# Patient Record
Sex: Male | Born: 1935 | Race: Black or African American | Hispanic: No | Smoking: Never smoker
Health system: Southern US, Community
[De-identification: ages and names within clinical notes are randomized; demographics above are authoritative.]

## PROBLEM LIST (undated history)

## (undated) DIAGNOSIS — H269 Unspecified cataract: Secondary | ICD-10-CM

## (undated) DIAGNOSIS — M1990 Unspecified osteoarthritis, unspecified site: Secondary | ICD-10-CM

## (undated) HISTORY — PX: OTHER SURGICAL HISTORY: SHX169

## (undated) HISTORY — PX: RECTAL SURGERY: SHX760

## (undated) HISTORY — PX: JOINT REPLACEMENT: SHX530

## (undated) SURGERY — MINOR CAPSULOTOMY
Anesthesia: Choice | Laterality: Right

---

## 1997-08-04 ENCOUNTER — Inpatient Hospital Stay (HOSPITAL_COMMUNITY): Admission: EM | Admit: 1997-08-04 | Discharge: 1997-08-09 | Payer: Self-pay | Admitting: Emergency Medicine

## 2001-10-13 ENCOUNTER — Encounter: Payer: Self-pay | Admitting: Emergency Medicine

## 2001-10-13 ENCOUNTER — Inpatient Hospital Stay (HOSPITAL_COMMUNITY): Admission: EM | Admit: 2001-10-13 | Discharge: 2001-10-14 | Payer: Self-pay | Admitting: *Deleted

## 2003-03-12 ENCOUNTER — Encounter: Admission: RE | Admit: 2003-03-12 | Discharge: 2003-03-12 | Payer: Self-pay | Admitting: Internal Medicine

## 2003-04-13 ENCOUNTER — Ambulatory Visit (HOSPITAL_COMMUNITY): Admission: RE | Admit: 2003-04-13 | Discharge: 2003-04-13 | Payer: Self-pay | Admitting: *Deleted

## 2004-11-18 IMAGING — CT CT CHEST W/ CM
1 of 2 series · 14 of 29 positions shown, 18 images · IV contrast (125 ml omni 300)
Comparison: none

CLINICAL DATA: Follow-up thoracic aneurysm.  Con ? none
 CT SCAN OF THE CHEST WITH CONTRAST
 Spiral scanning was performed during intravenous administration of 125 cc of Omnipaque 300.  Comparison is made to a previous chest radiograph. 
 The lungs are clear.  No evidence of mass, infiltrate, or atelectasis.  There is no pleural or pericardial fluid.  There is no mediastinal or hilar adenopathy.  The thoracic aorta is unfolded, but there is no aneurysm.  The patient does not show microscopic calcification within the wall of the aorta or any luminal thrombus or other vascular irregularity.  Pulmonary arterial tree is likewise normal.  
 Scans in the upper abdomen show no significant lesion.  There is probably a 1 cm cyst at the upper pole of the left kidney. 
 IMPRESSION
 Negative CT scan of the chest.  No vascular pathology evident.  See above for full discussion.

[Series 2: routine chest · axial · 0.66mm/px · z∈[-219,+21]mm · 14 of 56 slices shown, 18 images]
[im 4/56  mediastinal]
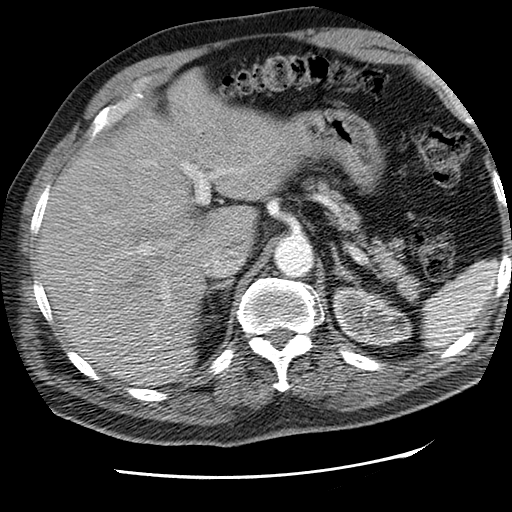
[im 4/56  lung]
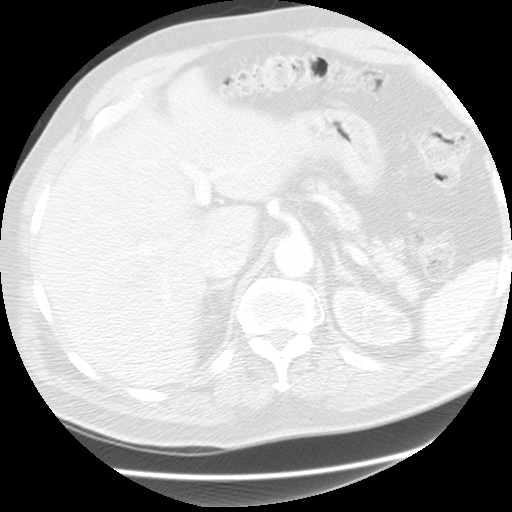
[im 8/56  lung]
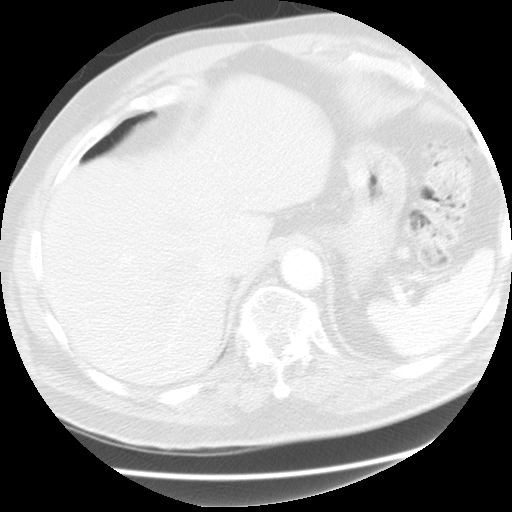
[im 12/56  lung]
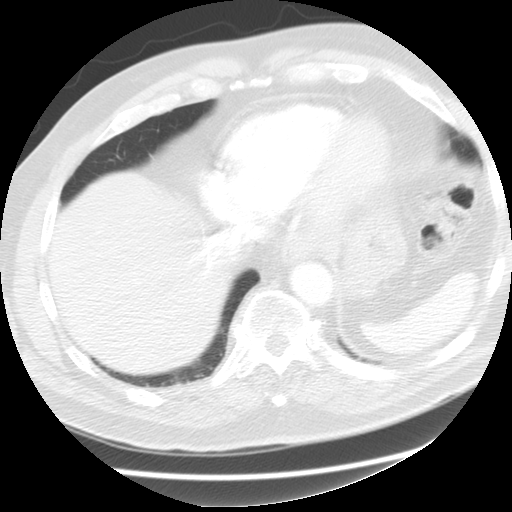
[im 16/56  lung]
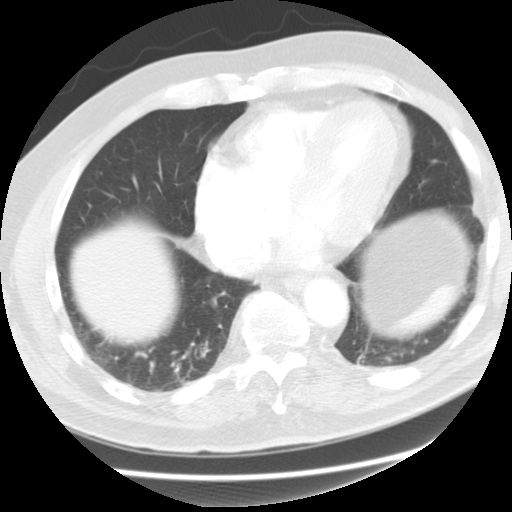
[im 20/56  mediastinal]
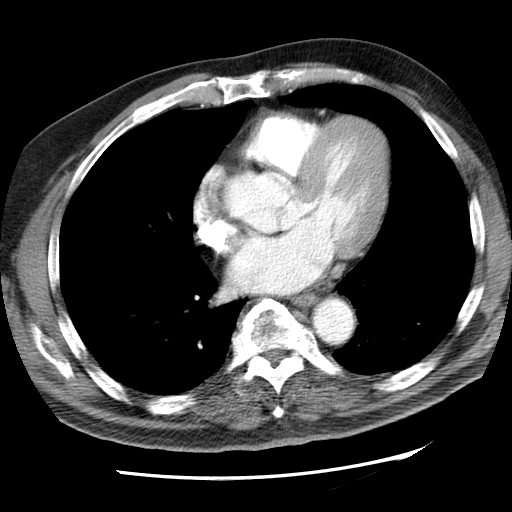
[im 20/56  lung]
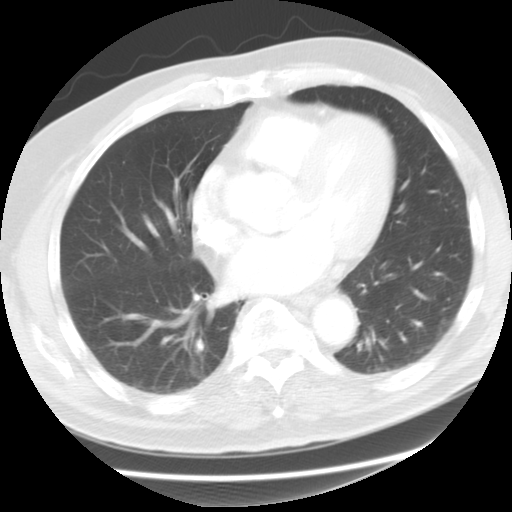
[im 24/56  lung]
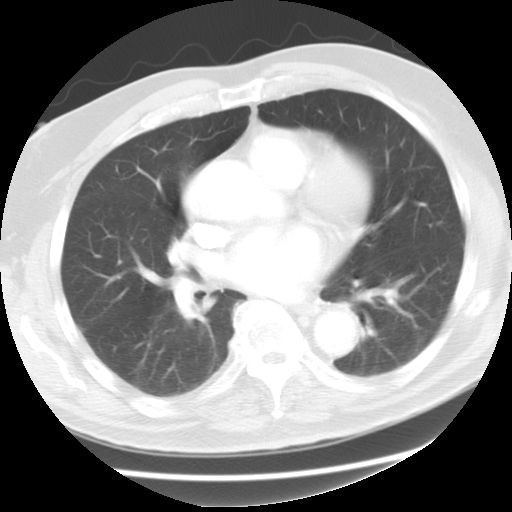
[im 27/56  lung]
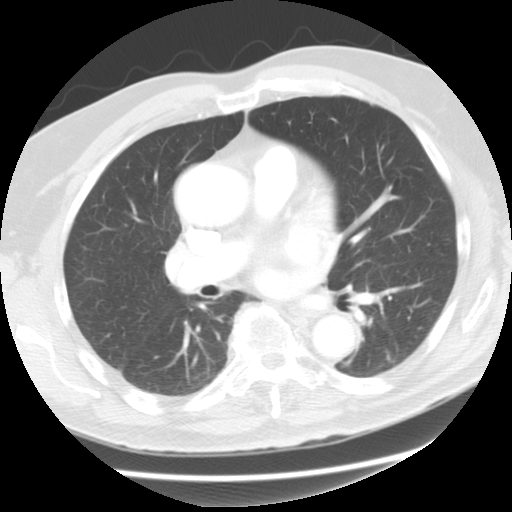
[im 28/56  lung]
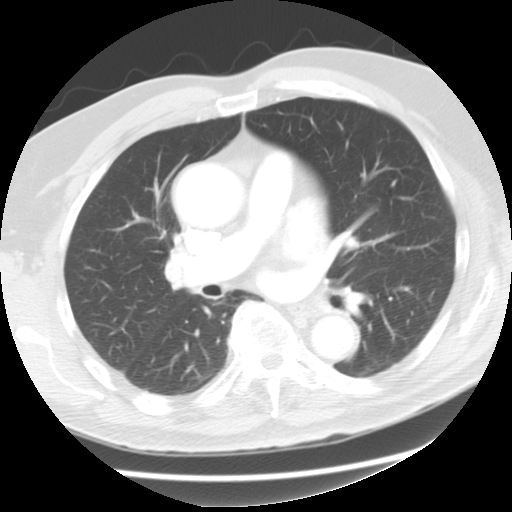
[im 32/56  mediastinal]
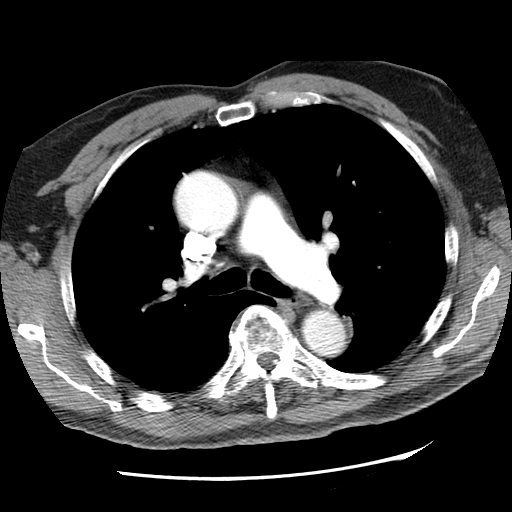
[im 32/56  lung]
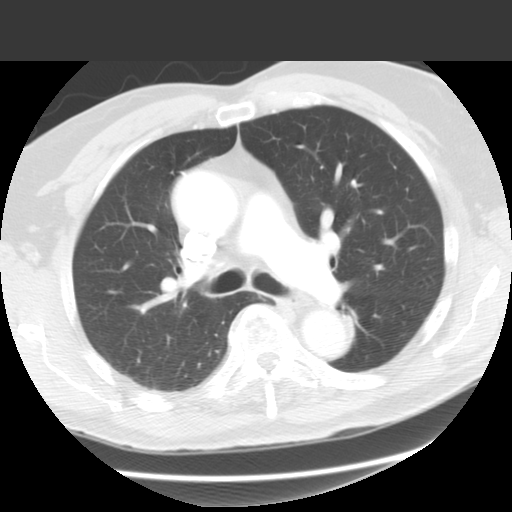
[im 36/56  lung]
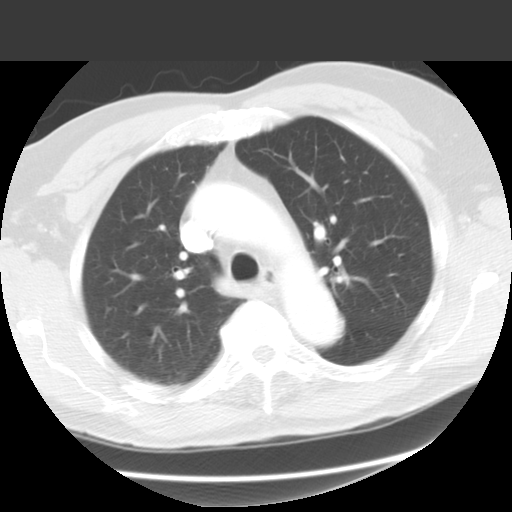
[im 40/56  lung]
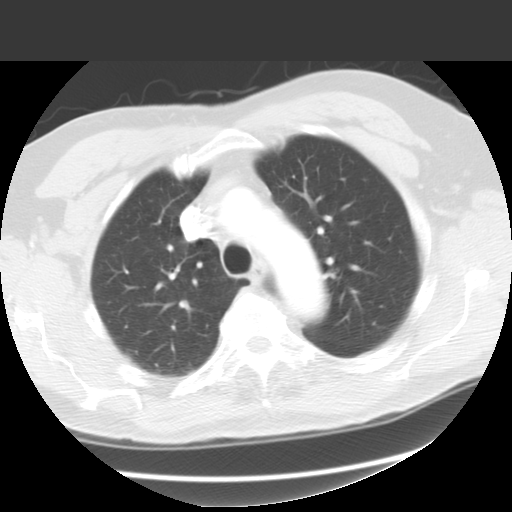
[im 44/56  lung]
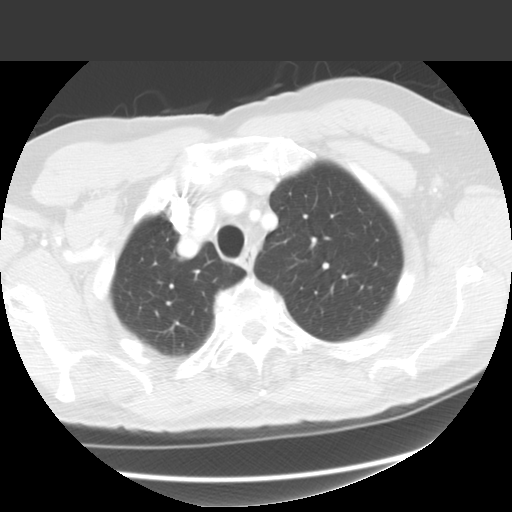
[im 48/56  mediastinal]
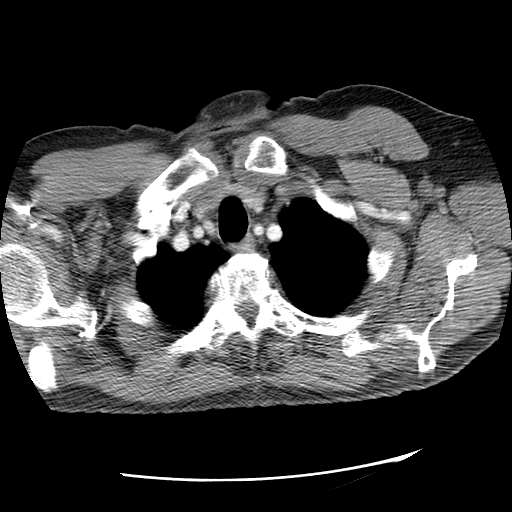
[im 48/56  lung]
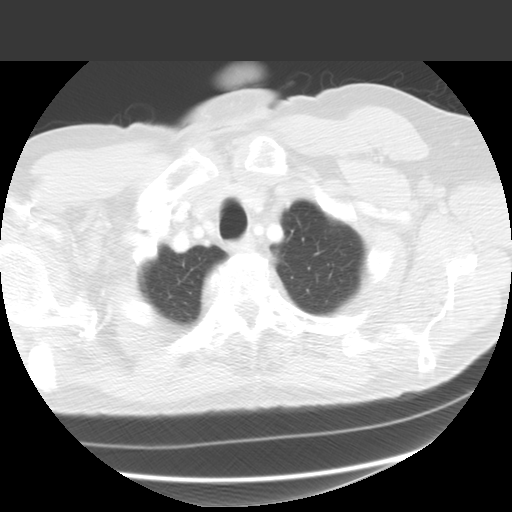
[im 52/56  lung]
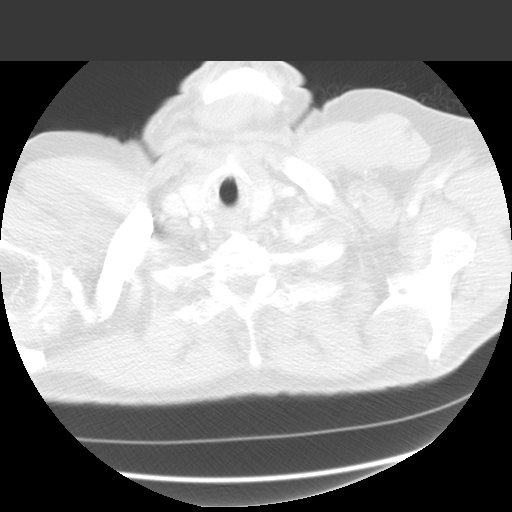

[14 of 29 positions shown; findings below may reference images not displayed]

## 2005-06-21 ENCOUNTER — Ambulatory Visit (HOSPITAL_COMMUNITY): Admission: RE | Admit: 2005-06-21 | Discharge: 2005-06-21 | Payer: Self-pay | Admitting: *Deleted

## 2011-11-06 ENCOUNTER — Other Ambulatory Visit: Payer: Self-pay | Admitting: Ophthalmology

## 2011-11-06 NOTE — H&P (Signed)
  Pre-operative History and Physical for Ophthalmic Surgery  Joshua Frost 11/06/2011                  Chief Complaint: Decreased vision  Diagnosis: Cataract right eye  Allergies not on file No known Allergies  Medications: Vitamin B12, VitaminD, Multivitamins  Prior to Admission medications   Not on File    Planned Procedure:                                       Phacoemulsification, Posterior Chamber Intra-ocular Lens Right Eye                                       Acrysof MA50BM  + 21. 00 Diopter for implant OD   There were no vitals filed for this visit.  Pulse: 76         Temp: NE        Resp:  18       ROS: non-contributory  No past medical history on file.  No past surgical history on file.   History   Social History  . Marital Status: Widowed    Spouse Name: N/A    Number of Children: N/A  . Years of Education: N/A   Occupational History  . Not on file.   Social History Main Topics  . Smoking status: Not on file  . Smokeless tobacco: Not on file  . Alcohol Use: Not on file  . Drug Use: Not on file  . Sexually Active: Not on file   Other Topics Concern  . Not on file   Social History Narrative  . No narrative on file     The following examination is for anesthesia clearance for minimally invasive Ophthalmic surgery. It is primarily to document heart and lung findings and is not intended to elucidate unknown general medical conditions inclusive of abdominal masses, lung lesions, etc.   General Constitution:  Post right hip replacement with reduced range of motion   Alertness/Orientation:  Person, time place     yes   HEENT:  Eye Findings: Cataract                  left eye  Neck: supple without masses  Chest/Lungs: clear to auscultation  Cardiac: Normal S1 and S2 without Murmur, S3 or S4  Neuro: non-focal  Impression: Cataract right eye  Planned Procedure:  Phacoemulsification, Posterior Chamber Intraocular Lens OD    Shade Flood, MD

## 2011-11-07 ENCOUNTER — Encounter (HOSPITAL_COMMUNITY): Payer: Self-pay | Admitting: Pharmacy Technician

## 2011-11-15 ENCOUNTER — Encounter (HOSPITAL_COMMUNITY): Payer: Self-pay

## 2011-11-15 ENCOUNTER — Encounter (HOSPITAL_COMMUNITY)
Admission: RE | Admit: 2011-11-15 | Discharge: 2011-11-15 | Disposition: A | Discharge status: Home / Self Care | Payer: Medicare Other | Source: Ambulatory Visit | Attending: Ophthalmology | Admitting: Ophthalmology

## 2011-11-15 HISTORY — DX: Unspecified cataract: H26.9

## 2011-11-15 HISTORY — DX: Unspecified osteoarthritis, unspecified site: M19.90

## 2011-11-15 LAB — CBC
HCT: 39.2 % (ref 39.0–52.0)
MCH: 30.4 pg (ref 26.0–34.0)
MCHC: 33.7 g/dL (ref 30.0–36.0)
MCV: 90.3 fL (ref 78.0–100.0)
Platelets: 134 10*3/uL — ABNORMAL LOW (ref 150–400)
RDW: 13.4 % (ref 11.5–15.5)
WBC: 5 10*3/uL (ref 4.0–10.5)

## 2011-11-15 NOTE — Progress Notes (Signed)
Pt doesn't have a cardiologist  Stress test/echo done 20+yrs ago  Denies ever having a heart cath  Dr.Walter Renne Crigler is [facility]  EKG done at yearly physical-to request copy from Dr.Pharr Denies having a cxr in the past year

## 2011-11-15 NOTE — Pre-Procedure Instructions (Signed)
20 Joshua Frost  11/15/2011   Your procedure is scheduled on:  Wed, Nov 6 @ 3:00 PM  Report to [facility] at 1:00 PM.  Call this number if you have problems the morning of surgery: [phone]   Remember:   Do not eat food:After Midnight.     Do not wear jewelry  Do not wear lotions, powders, or colognes. You may wear deodorant.  Men may shave face and neck.  Do not bring valuables to the hospital.  Contacts, dentures or bridgework may not be worn into surgery.  Leave suitcase in the car. After surgery it may be brought to your room.  For patients admitted to the hospital, checkout time is 11:00 AM the day of discharge.   Patients discharged the day of surgery will not be allowed to drive home.    Special Instructions: Shower using CHG 2 nights before surgery and the night before surgery.  If you shower the day of surgery use CHG.  Use special wash - you have one bottle of CHG for all showers.  You should use approximately 1/3 of the bottle for each shower.   Please read over the following fact sheets that you were given: Pain Booklet, Coughing and Deep Breathing and Surgical Site Infection Prevention

## 2011-11-21 MED ORDER — GATIFLOXACIN 0.5 % OP SOLN
1.0000 [drp] | OPHTHALMIC | Status: AC | PRN
  Administered 2011-11-22 (×3): 1 [drp] via OPHTHALMIC
  Filled 2011-11-21: qty 2.5

## 2011-11-21 MED ORDER — TETRACAINE HCL 0.5 % OP SOLN
2.0000 [drp] | OPHTHALMIC | Status: AC
  Administered 2011-11-22: 2 [drp] via OPHTHALMIC
  Filled 2011-11-21: qty 2

## 2011-11-21 MED ORDER — PHENYLEPHRINE HCL 2.5 % OP SOLN
1.0000 [drp] | OPHTHALMIC | Status: AC | PRN
  Administered 2011-11-22 (×3): 1 [drp] via OPHTHALMIC
  Filled 2011-11-21: qty 3

## 2011-11-21 MED ORDER — PREDNISOLONE ACETATE 1 % OP SUSP
1.0000 [drp] | OPHTHALMIC | Status: AC
  Administered 2011-11-22: 1 [drp] via OPHTHALMIC
  Filled 2011-11-21: qty 5

## 2011-11-22 ENCOUNTER — Encounter (HOSPITAL_COMMUNITY): Payer: Self-pay | Admitting: Anesthesiology

## 2011-11-22 ENCOUNTER — Encounter (HOSPITAL_COMMUNITY)
Admission: RE | Disposition: A | Discharge status: Home / Self Care | Payer: Self-pay | Source: Ambulatory Visit | Attending: Ophthalmology

## 2011-11-22 ENCOUNTER — Encounter (HOSPITAL_COMMUNITY): Payer: Self-pay | Admitting: *Deleted

## 2011-11-22 ENCOUNTER — Ambulatory Visit (HOSPITAL_COMMUNITY): Payer: Medicare Other | Admitting: Anesthesiology

## 2011-11-22 ENCOUNTER — Ambulatory Visit (HOSPITAL_COMMUNITY)
Admission: RE | Admit: 2011-11-22 | Discharge: 2011-11-22 | Disposition: A | Discharge status: Home / Self Care | Payer: Medicare Other | Source: Ambulatory Visit | Attending: Ophthalmology | Admitting: Ophthalmology

## 2011-11-22 DIAGNOSIS — Z96649 Presence of unspecified artificial hip joint: Secondary | ICD-10-CM | POA: Insufficient documentation

## 2011-11-22 DIAGNOSIS — H2510 Age-related nuclear cataract, unspecified eye: Secondary | ICD-10-CM | POA: Insufficient documentation

## 2011-11-22 DIAGNOSIS — Z01812 Encounter for preprocedural laboratory examination: Secondary | ICD-10-CM | POA: Insufficient documentation

## 2011-11-22 HISTORY — PX: CATARACT EXTRACTION W/PHACO: SHX586

## 2011-11-22 SURGERY — PHACOEMULSIFICATION, CATARACT, WITH IOL INSERTION
Anesthesia: Monitor Anesthesia Care | Site: Eye | Laterality: Right | Wound class: Clean

## 2011-11-22 MED ORDER — PROMETHAZINE HCL 25 MG/ML IJ SOLN: 6.2500 mg | INTRAMUSCULAR | Status: DC | PRN

## 2011-11-22 MED ORDER — MEPERIDINE HCL 25 MG/ML IJ SOLN: 6.2500 mg | INTRAMUSCULAR | Status: DC | PRN

## 2011-11-22 MED ORDER — MIDAZOLAM HCL 2 MG/2ML IJ SOLN: 0.5000 mg | Freq: Once | INTRAMUSCULAR | Status: DC | PRN

## 2011-11-22 MED ORDER — FENTANYL CITRATE 0.05 MG/ML IJ SOLN: 25.0000 ug | INTRAMUSCULAR | Status: DC | PRN

## 2011-11-22 MED ORDER — TRIAMCINOLONE ACETONIDE 40 MG/ML IJ SUSP
INTRAMUSCULAR | Status: AC
  Filled 2011-11-22: qty 1

## 2011-11-22 MED ORDER — DEXAMETHASONE SODIUM PHOSPHATE 10 MG/ML IJ SOLN
INTRAMUSCULAR | Status: DC | PRN
  Administered 2011-11-22: 10 mg

## 2011-11-22 MED ORDER — BACITRACIN-POLYMYXIN B 500-10000 UNIT/GM OP OINT
TOPICAL_OINTMENT | OPHTHALMIC | Status: AC
  Filled 2011-11-22: qty 3.5

## 2011-11-22 MED ORDER — BSS IO SOLN
INTRAOCULAR | Status: DC | PRN
  Administered 2011-11-22: 15 mL via INTRAOCULAR

## 2011-11-22 MED ORDER — BACITRACIN-POLYMYXIN B 500-10000 UNIT/GM OP OINT
TOPICAL_OINTMENT | OPHTHALMIC | Status: DC | PRN
  Administered 2011-11-22: 1 via OPHTHALMIC

## 2011-11-22 MED ORDER — OXYCODONE HCL 5 MG/5ML PO SOLN: 5.0000 mg | Freq: Once | ORAL | Status: DC | PRN

## 2011-11-22 MED ORDER — PROVISC 10 MG/ML IO SOLN
INTRAOCULAR | Status: DC | PRN
  Administered 2011-11-22: .85 mL via INTRAOCULAR

## 2011-11-22 MED ORDER — DEXAMETHASONE SODIUM PHOSPHATE 10 MG/ML IJ SOLN
INTRAMUSCULAR | Status: AC
  Filled 2011-11-22: qty 1

## 2011-11-22 MED ORDER — HYPROMELLOSE (GONIOSCOPIC) 2.5 % OP SOLN
OPHTHALMIC | Status: AC
  Filled 2011-11-22: qty 15

## 2011-11-22 MED ORDER — CEFAZOLIN SUBCONJUNCTIVAL INJECTION 100 MG/0.5 ML
200.0000 mg | INJECTION | SUBCONJUNCTIVAL | Status: AC
  Administered 2011-11-22: 200 mg via SUBCONJUNCTIVAL
  Filled 2011-11-22: qty 1

## 2011-11-22 MED ORDER — NA CHONDROIT SULF-NA HYALURON 40-30 MG/ML IO SOLN
INTRAOCULAR | Status: AC
  Filled 2011-11-22: qty 0.5

## 2011-11-22 MED ORDER — OXYCODONE HCL 5 MG PO TABS: 5.0000 mg | ORAL_TABLET | Freq: Once | ORAL | Status: DC | PRN

## 2011-11-22 MED ORDER — NA CHONDROIT SULF-NA HYALURON 40-30 MG/ML IO SOLN
INTRAOCULAR | Status: DC | PRN
  Administered 2011-11-22: 0.5 mL via INTRAOCULAR

## 2011-11-22 MED ORDER — SODIUM CHLORIDE 0.9 % IV SOLN
INTRAVENOUS | Status: DC | PRN
  Administered 2011-11-22: 17:00:00 via INTRAVENOUS

## 2011-11-22 MED ORDER — EPINEPHRINE HCL 1 MG/ML IJ SOLN
INTRAOCULAR | Status: DC | PRN
  Administered 2011-11-22: 17:00:00

## 2011-11-22 MED ORDER — SODIUM CHLORIDE 0.9 % IV SOLN
INTRAVENOUS | Status: DC
  Administered 2011-11-22 (×2): via INTRAVENOUS

## 2011-11-22 MED ORDER — PROPOFOL 10 MG/ML IV BOLUS
INTRAVENOUS | Status: DC | PRN
  Administered 2011-11-22: 40 mg via INTRAVENOUS

## 2011-11-22 MED ORDER — BSS IO SOLN
INTRAOCULAR | Status: AC
  Filled 2011-11-22: qty 500

## 2011-11-22 MED ORDER — LIDOCAINE HCL 2 % IJ SOLN
INTRAMUSCULAR | Status: AC
  Filled 2011-11-22: qty 20

## 2011-11-22 MED ORDER — LIDOCAINE HCL (PF) 2 % IJ SOLN
INTRAMUSCULAR | Status: DC | PRN
  Administered 2011-11-22: 10 mL

## 2011-11-22 MED ORDER — SODIUM HYALURONATE 10 MG/ML IO SOLN
INTRAOCULAR | Status: AC
  Filled 2011-11-22: qty 0.85

## 2011-11-22 MED ORDER — HYPROMELLOSE (GONIOSCOPIC) 2.5 % OP SOLN
OPHTHALMIC | Status: DC | PRN
  Administered 2011-11-22: 1 [drp] via OPHTHALMIC

## 2011-11-22 MED ORDER — EPINEPHRINE HCL 1 MG/ML IJ SOLN
INTRAMUSCULAR | Status: AC
  Filled 2011-11-22: qty 1

## 2011-11-22 MED ORDER — BUPIVACAINE HCL (PF) 0.75 % IJ SOLN
INTRAMUSCULAR | Status: DC | PRN
  Administered 2011-11-22: 10 mL

## 2011-11-22 MED ORDER — ACETAZOLAMIDE SODIUM 500 MG IJ SOLR
INTRAMUSCULAR | Status: AC
  Filled 2011-11-22: qty 500

## 2011-11-22 SURGICAL SUPPLY — 56 items
APL SRG 3 HI ABS STRL LF PLS (MISCELLANEOUS) ×1
APPLICATOR COTTON TIP 6IN STRL (MISCELLANEOUS) ×2 IMPLANT
APPLICATOR DR MATTHEWS STRL (MISCELLANEOUS) ×2 IMPLANT
BAG FLD CLT MN 6.25X3.5 (WOUND CARE) ×1
BAG MINI COLL DRAIN (WOUND CARE) ×2 IMPLANT
BLADE EYE MINI 60D BEAVER (BLADE) IMPLANT
BLADE KERATOME 2.75 (BLADE) ×2 IMPLANT
BLADE STAB KNIFE 15DEG (BLADE) IMPLANT
CANNULA ANTERIOR CHAMBER 27GA (MISCELLANEOUS) IMPLANT
CLOTH BEACON ORANGE TIMEOUT ST (SAFETY) ×2 IMPLANT
DRAPE OPHTHALMIC 77X100 STRL (CUSTOM PROCEDURE TRAY) ×2 IMPLANT
DRAPE POUCH INSTRU U-SHP 10X18 (DRAPES) ×2 IMPLANT
DRSG TEGADERM 4X4.75 (GAUZE/BANDAGES/DRESSINGS) ×2 IMPLANT
GLOVE SS BIOGEL STRL SZ 6.5 (GLOVE) ×1 IMPLANT
GLOVE SUPERSENSE BIOGEL SZ 6.5 (GLOVE) ×1
GOWN SRG XL XLNG 56XLVL 4 (GOWN DISPOSABLE) ×1 IMPLANT
GOWN STRL NON-REIN LRG LVL3 (GOWN DISPOSABLE) ×2 IMPLANT
GOWN STRL NON-REIN XL XLG LVL4 (GOWN DISPOSABLE) ×2
KIT BASIN OR (CUSTOM PROCEDURE TRAY) ×2 IMPLANT
KIT ROOM TURNOVER OR (KITS) ×1 IMPLANT
KNIFE GRIESHABER SHARP 2.5MM (MISCELLANEOUS) ×2 IMPLANT
LENS IOL ACRYSOF MP POST 21.0 (Intraocular Lens) ×1 IMPLANT
MASK EYE SHIELD (GAUZE/BANDAGES/DRESSINGS) ×1 IMPLANT
NDL 18GX1X1/2 (RX/OR ONLY) (NEEDLE) IMPLANT
NDL 25GX 5/8IN NON SAFETY (NEEDLE) ×1 IMPLANT
NDL FILTER BLUNT 18X1 1/2 (NEEDLE) IMPLANT
NDL HYPO 30X.5 LL (NEEDLE) ×2 IMPLANT
NEEDLE 18GX1X1/2 (RX/OR ONLY) (NEEDLE) ×2 IMPLANT
NEEDLE 22X1 1/2 (OR ONLY) (NEEDLE) ×2 IMPLANT
NEEDLE 25GX 5/8IN NON SAFETY (NEEDLE) ×10 IMPLANT
NEEDLE FILTER BLUNT 18X 1/2SAF (NEEDLE) ×1
NEEDLE FILTER BLUNT 18X1 1/2 (NEEDLE) ×1 IMPLANT
NEEDLE HYPO 30X.5 LL (NEEDLE) ×4 IMPLANT
NS IRRIG 1000ML POUR BTL (IV SOLUTION) ×2 IMPLANT
PACK CATARACT CUSTOM (CUSTOM PROCEDURE TRAY) ×2 IMPLANT
PACK CATARACT MCHSCP (PACKS) ×2 IMPLANT
PAD ARMBOARD 7.5X6 YLW CONV (MISCELLANEOUS) ×3 IMPLANT
PAD EYE OVAL STERILE LF (GAUZE/BANDAGES/DRESSINGS) ×1 IMPLANT
PHACO TIP KELMAN 45DEG (TIP) ×2 IMPLANT
SHUTTLE MONARCH TYPE A (NEEDLE) ×2 IMPLANT
SOLUTION ANTI FOG 6CC (MISCELLANEOUS) ×2 IMPLANT
SPEAR EYE SURG WECK-CEL (MISCELLANEOUS) ×2 IMPLANT
SUT ETHILON 10-0 CS-B-6CS-B-6 (SUTURE)
SUT ETHILON 5 0 P 3 18 (SUTURE)
SUT ETHILON 9 0 TG140 8 (SUTURE) IMPLANT
SUT NYLON ETHILON 5-0 P-3 1X18 (SUTURE) IMPLANT
SUT PLAIN 6 0 TG1408 (SUTURE) IMPLANT
SUT POLY NON ABSORB 10-0 8 STR (SUTURE) IMPLANT
SUTURE EHLN 10-0 CS-B-6CS-B-6 (SUTURE) IMPLANT
SYR 5ML LL (SYRINGE) IMPLANT
SYR TB 1ML LUER SLIP (SYRINGE) ×1 IMPLANT
SYRINGE 10CC LL (SYRINGE) IMPLANT
TAPE PAPER MEDFIX 1IN X 10YD (GAUZE/BANDAGES/DRESSINGS) ×1 IMPLANT
TOWEL OR 17X24 6PK STRL BLUE (TOWEL DISPOSABLE) ×4 IMPLANT
WATER STERILE IRR 1000ML POUR (IV SOLUTION) ×2 IMPLANT
WIPE INSTRUMENT VISIWIPE 73X73 (MISCELLANEOUS) ×2 IMPLANT

## 2011-11-22 NOTE — Op Note (Signed)
Joshua Frost 11/22/2011 Cataract: Combined, Nuclear  Procedure: Phacoemulsification, Posterior Chamber Intra-ocular Lens Operative Eye:  right eye  Surgeon: Shade Flood Estimated Blood Loss: minimal Specimens for Pathology:  None Complications: none  The patient was prepared and draped in the usual manner for ocular surgery on the right eye. A Cook lid speculum was placed. A peripheral clear corneal incision was made at the surgical limbus centered at the 11:00 meridian. A separate clear corneal stab incision was made with a 15 degree blade at the 2:00 meridian to permit bi-manual technique. Viscoat and  Provisc as an underlying layer next to the capsule was instilled into the anterior chamber through that incision.  A keratome was used to create a self sealing incision entering the anterior chamber at the 11:00 meridian. A capsulorhexis was performed using a bent 25g needle. The lens was hydrodissected and the nucleus was hydrodilineated using a Nichammin cannula. The Chang chopper was inserted and used to rotate the lens to insure adequate lens mobility. The phacoemulsification handpiece was inserted and a combined phaco-chop technique was employed, fracturing the lens into separate sections with subsequent removal with the phaco handpiece. The I/A cannula was used to remove remaining lens cortex. Provisc was instilled and used to deepen the anterior chamber and posterior capsule bag. The Monarch injector was used to place a folded Acrysof MA50BM PC IOL, + 21.00  diopters, into the capsule bag. A Sinskey lens hook was used to dial in the trailing haptic.  The I/A cannula was used to remove the viscoelastic from the anterior chamber. BSS was used to bring IOP to the desired range and the wound was checked to insure it was watertight. Subconjunctival injections of Ancef 100/0.3ml and Dexamethasone 0.5 ml of a 10mg /56ml solution were placed without complication. The lid speculum and drapes were  removed and the patient's eye was patched with Polymixin/Bacitracin ophthalmic ointment. An eye shield was placed and the patient was transferred alert and conversant from the operating room to the post-operative recovery area.   Shade Flood, MD

## 2011-11-22 NOTE — Transfer of Care (Signed)
Immediate Anesthesia Transfer of Care Note  Patient: Joshua Frost  Procedure(s) Performed: Procedure(s) (LRB) with comments: CATARACT EXTRACTION PHACO AND INTRAOCULAR LENS PLACEMENT (IOC) (Right)  Patient Location: PACU  Anesthesia Type:MAC  Level of Consciousness: awake, alert , oriented and patient cooperative  Airway & Oxygen Therapy: Patient Spontanous Breathing  Post-op Assessment: Report given to PACU RN, Post -op Vital signs reviewed and stable and Patient moving all extremities  Post vital signs: Reviewed and stable  Complications: No apparent anesthesia complications

## 2011-11-22 NOTE — Anesthesia Preprocedure Evaluation (Signed)
Anesthesia Evaluation  Patient identified by MRN, date of birth, ID band Patient awake    Reviewed: Allergy & Precautions, H&P , NPO status , Patient's Chart, lab work & pertinent test results  History of Anesthesia Complications Negative for: history of anesthetic complications  Airway Mallampati: I TM Distance: >3 FB Neck ROM: Full    Dental  (+) Caps, Missing, Edentulous Upper, Poor Dentition, Loose and Dental Advisory Given   Pulmonary neg pulmonary ROS,  breath sounds clear to auscultation  Pulmonary exam normal       Cardiovascular negative cardio ROS  Rhythm:Regular Rate:Normal     Neuro/Psych negative neurological ROS     GI/Hepatic negative GI ROS, Neg liver ROS,   Endo/Other  negative endocrine ROS  Renal/GU negative Renal ROS     Musculoskeletal   Abdominal   Peds  Hematology negative hematology ROS (+)   Anesthesia Other Findings   Reproductive/Obstetrics                           Anesthesia Physical Anesthesia Plan  ASA: II  Anesthesia Plan: MAC   Post-op Pain Management:    Induction: Intravenous  Airway Management Planned: Nasal Cannula  Additional Equipment:   Intra-op Plan:   Post-operative Plan:   Informed Consent: I have reviewed the patients History and Physical, chart, labs and discussed the procedure including the risks, benefits and alternatives for the proposed anesthesia with the patient or authorized representative who has indicated his/her understanding and acceptance.   Dental advisory given  Plan Discussed with: CRNA and Surgeon  Anesthesia Plan Comments: (Plan routine monitors, MAC with eye block by Dr. Clarisa Kindred)        Anesthesia Quick Evaluation

## 2011-11-22 NOTE — Anesthesia Postprocedure Evaluation (Signed)
  Anesthesia Post-op Note  Patient: Joshua Frost  Procedure(s) Performed: Procedure(s) (LRB) with comments: CATARACT EXTRACTION PHACO AND INTRAOCULAR LENS PLACEMENT (IOC) (Right)  Patient Location: PACU  Anesthesia Type:MAC  Level of Consciousness: awake, alert , oriented and patient cooperative  Airway and Oxygen Therapy: Patient Spontanous Breathing  Post-op Pain: none  Post-op Assessment: Post-op Vital signs reviewed, Patient's Cardiovascular Status Stable, Respiratory Function Stable, Patent Airway, No signs of Nausea or vomiting, Adequate PO intake and Pain level controlled  Post-op Vital Signs: Reviewed and stable  Complications: No apparent anesthesia complications

## 2011-11-22 NOTE — Preoperative (Signed)
Beta Blockers   Reason not to administer Beta Blockers:Not Applicable 

## 2011-11-22 NOTE — Anesthesia Postprocedure Evaluation (Signed)
  Anesthesia Post-op Note  Patient: Joshua Frost  Procedure(s) Performed: Procedure(s) (LRB) with comments: CATARACT EXTRACTION PHACO AND INTRAOCULAR LENS PLACEMENT (IOC) (Right)  Patient Location: PACU  Anesthesia Type:MAC  Level of Consciousness: awake  Airway and Oxygen Therapy: Patient Spontanous Breathing  Post-op Pain: none  Post-op Assessment: Post-op Vital signs reviewed, Patient's Cardiovascular Status Stable, Respiratory Function Stable, Patent Airway, No signs of Nausea or vomiting and Pain level controlled  Post-op Vital Signs: stable  Complications: No apparent anesthesia complications

## 2011-11-22 NOTE — H&P (View-Only) (Signed)
  Pre-operative History and Physical for Ophthalmic Surgery  Joshua Frost 11/06/2011                  Chief Complaint: Decreased vision  Diagnosis: Cataract right eye  Allergies not on file No known Allergies  Medications: Vitamin B12, VitaminD, Multivitamins  Prior to Admission medications   Not on File    Planned Procedure:                                       Phacoemulsification, Posterior Chamber Intra-ocular Lens Right Eye                                       Acrysof MA50BM  + 21. 00 Diopter for implant OD   There were no vitals filed for this visit.  Pulse: 76         Temp: NE        Resp:  18       ROS: non-contributory  No past medical history on file.  No past surgical history on file.   History   Social History  . Marital Status: Widowed    Spouse Name: N/A    Number of Children: N/A  . Years of Education: N/A   Occupational History  . Not on file.   Social History Main Topics  . Smoking status: Not on file  . Smokeless tobacco: Not on file  . Alcohol Use: Not on file  . Drug Use: Not on file  . Sexually Active: Not on file   Other Topics Concern  . Not on file   Social History Narrative  . No narrative on file     The following examination is for anesthesia clearance for minimally invasive Ophthalmic surgery. It is primarily to document heart and lung findings and is not intended to elucidate unknown general medical conditions inclusive of abdominal masses, lung lesions, etc.   General Constitution:  Post right hip replacement with reduced range of motion   Alertness/Orientation:  Person, time place     yes   HEENT:  Eye Findings: Cataract                  left eye  Neck: supple without masses  Chest/Lungs: clear to auscultation  Cardiac: Normal S1 and S2 without Murmur, S3 or S4  Neuro: non-focal  Impression: Cataract right eye  Planned Procedure:  Phacoemulsification, Posterior Chamber Intraocular Lens OD    Joshua Frost,  Joshua Loewenstein, MD        

## 2011-11-22 NOTE — Interval H&P Note (Signed)
History and Physical Interval Note:  11/22/2011 4:20 PM  Joshua Frost  has presented today for surgery, with the diagnosis of Cataract  The various methods of treatment have been discussed with the patient and family. After consideration of risks, benefits and other options for treatment, the patient has consented to  Procedure(s) (LRB) with comments: CATARACT EXTRACTION PHACO AND INTRAOCULAR LENS PLACEMENT (IOC) (Right) as a surgical intervention .  The patient's history has been reviewed, patient examined, no change in status, stable for surgery.  I have reviewed the patient's chart and labs.  Questions were answered to the patient's satisfaction.     Shade Flood, MD

## 2011-11-23 ENCOUNTER — Encounter (HOSPITAL_COMMUNITY): Payer: Self-pay | Admitting: Ophthalmology

## 2012-02-01 ENCOUNTER — Encounter (HOSPITAL_COMMUNITY): Payer: Self-pay

## 2012-02-07 ENCOUNTER — Encounter (HOSPITAL_COMMUNITY)
Admission: RE | Disposition: A | Discharge status: Home / Self Care | Payer: Self-pay | Source: Ambulatory Visit | Attending: Ophthalmology

## 2012-02-07 ENCOUNTER — Other Ambulatory Visit: Payer: Self-pay | Admitting: Ophthalmology

## 2012-02-07 ENCOUNTER — Ambulatory Visit (HOSPITAL_COMMUNITY)
Admission: RE | Admit: 2012-02-07 | Discharge: 2012-02-07 | Disposition: A | Discharge status: Home / Self Care | Payer: Medicare Other | Source: Ambulatory Visit | Attending: Ophthalmology | Admitting: Ophthalmology

## 2012-02-07 ENCOUNTER — Encounter: Payer: Self-pay | Admitting: Ophthalmology

## 2012-02-07 DIAGNOSIS — Z9849 Cataract extraction status, unspecified eye: Secondary | ICD-10-CM | POA: Insufficient documentation

## 2012-02-07 DIAGNOSIS — H353190 Nonexudative age-related macular degeneration, unspecified eye, stage unspecified: Secondary | ICD-10-CM | POA: Insufficient documentation

## 2012-02-07 DIAGNOSIS — H2510 Age-related nuclear cataract, unspecified eye: Secondary | ICD-10-CM | POA: Insufficient documentation

## 2012-02-07 HISTORY — PX: CAPSULOTOMY: SHX5412

## 2012-02-07 HISTORY — PX: YAG LASER APPLICATION: SHX6189

## 2012-02-07 SURGERY — MINOR CAPSULOTOMY
Anesthesia: LOCAL | Site: Eye | Laterality: Left

## 2012-02-07 MED ORDER — APRACLONIDINE HCL 0.5 % OP SOLN
OPHTHALMIC | Status: AC
  Filled 2012-02-07: qty 5

## 2012-02-07 MED ORDER — APRACLONIDINE HCL 1 % OP SOLN: 1.0000 [drp] | Freq: Once | OPHTHALMIC | Status: AC

## 2012-02-07 MED ORDER — CYCLOPENTOLATE-PHENYLEPHRINE 0.2-1 % OP SOLN
2.0000 [drp] | Freq: Once | OPHTHALMIC | Status: AC
  Administered 2012-02-07: 2 [drp] via OPHTHALMIC
  Filled 2012-02-07 (×2): qty 2

## 2012-02-07 MED ORDER — APRACLONIDINE HCL 1 % OP SOLN
1.0000 [drp] | Freq: Once | OPHTHALMIC | Status: AC
  Administered 2012-02-07: 1 [drp] via OPHTHALMIC
  Filled 2012-02-07: qty 0.1

## 2012-02-07 SURGICAL SUPPLY — 28 items
APPLICATOR COTTON TIP 6IN STRL (MISCELLANEOUS) ×2 IMPLANT
BAG FLD CLT MN 6.25X3.5 (WOUND CARE)
BAG MINI COLL DRAIN (WOUND CARE) IMPLANT
BLADE KERATOME 2.75 (BLADE) ×2 IMPLANT
BLADE MINI RND TIP GREEN BEAV (BLADE) IMPLANT
CLOTH BEACON ORANGE TIMEOUT ST (SAFETY) ×2 IMPLANT
CORDS BIPOLAR (ELECTRODE) ×2 IMPLANT
DRAPE OPHTHALMIC 40X48 W POUCH (DRAPES) ×2 IMPLANT
DRAPE RETRACTOR (MISCELLANEOUS) ×2 IMPLANT
GLOVE ECLIPSE 7.0 STRL STRAW (GLOVE) ×2 IMPLANT
GOWN STRL NON-REIN LRG LVL3 (GOWN DISPOSABLE) ×4 IMPLANT
KIT BASIN OR (CUSTOM PROCEDURE TRAY) ×2 IMPLANT
KIT ROOM TURNOVER OR (KITS) ×2 IMPLANT
KNIFE CRESCENT 2.5 55 ANG (BLADE) ×2 IMPLANT
MARKER SKIN DUAL TIP RULER LAB (MISCELLANEOUS) IMPLANT
NS IRRIG 1000ML POUR BTL (IV SOLUTION) ×2 IMPLANT
PACK CATARACT CUSTOM (CUSTOM PROCEDURE TRAY) ×2 IMPLANT
PACK CATARACT MCHSCP (PACKS) IMPLANT
PAD ARMBOARD 7.5X6 YLW CONV (MISCELLANEOUS) ×4 IMPLANT
PROBE ANTERIOR 20G W/INFUS NDL (MISCELLANEOUS) IMPLANT
SPEAR EYE SURG WECK-CEL (MISCELLANEOUS) IMPLANT
SUT ETHILON 10 0 CS140 6 (SUTURE) IMPLANT
SUT VICRYL 8 0 TG140 8 (SUTURE) IMPLANT
SYR 3ML LL SCALE MARK (SYRINGE) IMPLANT
TIP SILICONE STR (MISCELLANEOUS)
TIP SILICONE STR 0.3MM UFLOW (MISCELLANEOUS) IMPLANT
TOWEL OR 17X24 6PK STRL BLUE (TOWEL DISPOSABLE) ×4 IMPLANT
WATER STERILE IRR 1000ML POUR (IV SOLUTION) ×2 IMPLANT

## 2012-02-07 NOTE — Brief Op Note (Signed)
02/07/2012  8:13 AM  PATIENT:  Joshua Frost  79 y.o. male  PRE-OPERATIVE DIAGNOSIS:  OPAQUE POSTERIOR CAPSULE LEFT EYE  POST-OPERATIVE DIAGNOSIS:  * No post-op diagnosis entered *  PROCEDURE:  Procedure(s) (LRB) with comments: MINOR CAPSULOTOMY (Left) YAG LASER APPLICATION (Left)  SURGEON:  Surgeon(s) and Role:    Vita Erm., MD - Primary  PHYSICIAN ASSISTANT:   ASSISTANTS: none   ANESTHESIA:   none  EBL:     BLOOD ADMINISTERED:none  DRAINS: none   LOCAL MEDICATIONS USED:  NONE  SPECIMEN:  No Specimen  DISPOSITION OF SPECIMEN:  N/A  COUNTS:  YES  TOURNIQUET:  * No tourniquets in log *  DICTATION: .Other Dictation: Dictation Number [id number]  PLAN OF CARE: Discharge to home after PACU  PATIENT DISPOSITION:  Short Stay   Delay start of Pharmacological VTE agent (>24hrs) due to surgical blood loss or risk of bleeding: not applicable

## 2012-02-07 NOTE — Addendum Note (Signed)
Addended by: Salley Scarlet on: 02/07/2012 11:35 AM   Modules accepted: Orders

## 2012-02-07 NOTE — H&P (Signed)
  79 yo male has had cataract surgery right eye.  Now has blurred vision due to opaque posterior capsule.  Patient admitted today for yag laser capsulotomy right eye.

## 2012-02-08 NOTE — Op Note (Signed)
NAME:  Joshua Frost, Joshua Frost NO.:  [id number]  MEDICAL RECORD NO.:  [id number]  LOCATION:  [facility]                         FACILITY:  [facility]  PHYSICIAN:  Salley Scarlet., M.D.DATE OF BIRTH:  [birth year]  DATE OF PROCEDURE: DATE OF DISCHARGE:  02/07/2012                              OPERATIVE REPORT   PREOPERATIVE DIAGNOSIS:  Opaque posterior capsule, right eye.  POSTOP DIAGNOSIS:  Opaque posterior capsule, right eye.  OPERATION:  YAG laser capsulotomy.  JUSTIFICATION FOR PROCEDURE:  This is a 79 year old gentleman who has had cataract surgery in the right eye.  He has developed opacification at the posterior capsule, which has resulted in blurry vision.  YAG laser capsulotomy was recommended.  She is admitted at this time for the proposed procedure.  The patient has had surgery on his right hip and he is unable to flex it.  This makes it difficult to get him seated appropriately behind a YAG laser.  DESCRIPTION OF PROCEDURE:  The patient was seated behind the YAG laser with extreme amount of difficulty, but normally positioned as possible. Approximately 340 applications of laser, energy of 3.7 mJ were applied to the posterior capsule obtaining a satisfactory opening but with extreme difficulty.  The patient tolerated procedure well, was discharged in satisfactory condition with instructions to see me in the office today at 5 p.m. for further evaluation of intraocular pressures.  DISCHARGE DIAGNOSIS:  Opaque posterior capsule, right eye.     Salley Scarlet., M.D.     TB/MEDQ  D:  02/08/2012  T:  02/08/2012  Job:  [id number]

## 2012-02-09 ENCOUNTER — Encounter (HOSPITAL_COMMUNITY): Payer: Self-pay | Admitting: Ophthalmology

## 2012-02-09 ENCOUNTER — Other Ambulatory Visit: Payer: Self-pay | Admitting: Ophthalmology

## 2013-11-18 ENCOUNTER — Encounter (HOSPITAL_COMMUNITY): Payer: Self-pay | Admitting: Emergency Medicine

## 2013-11-18 ENCOUNTER — Emergency Department (HOSPITAL_COMMUNITY): Payer: Medicare Other

## 2013-11-18 ENCOUNTER — Emergency Department (HOSPITAL_COMMUNITY)
Admission: EM | Admit: 2013-11-18 | Discharge: 2013-11-18 | Discharge status: Against Medical Advice | Payer: Medicare Other | Attending: Emergency Medicine | Admitting: Emergency Medicine

## 2013-11-18 DIAGNOSIS — W458XXA Other foreign body or object entering through skin, initial encounter: Secondary | ICD-10-CM | POA: Insufficient documentation

## 2013-11-18 DIAGNOSIS — S91342A Puncture wound with foreign body, left foot, initial encounter: Secondary | ICD-10-CM | POA: Insufficient documentation

## 2013-11-18 DIAGNOSIS — Y9289 Other specified places as the place of occurrence of the external cause: Secondary | ICD-10-CM | POA: Diagnosis not present

## 2013-11-18 DIAGNOSIS — Z79899 Other long term (current) drug therapy: Secondary | ICD-10-CM | POA: Diagnosis not present

## 2013-11-18 DIAGNOSIS — Y9389 Activity, other specified: Secondary | ICD-10-CM | POA: Diagnosis not present

## 2013-11-18 DIAGNOSIS — M79673 Pain in unspecified foot: Secondary | ICD-10-CM

## 2013-11-18 DIAGNOSIS — Z8669 Personal history of other diseases of the nervous system and sense organs: Secondary | ICD-10-CM | POA: Diagnosis not present

## 2013-11-18 DIAGNOSIS — M1990 Unspecified osteoarthritis, unspecified site: Secondary | ICD-10-CM | POA: Diagnosis not present

## 2013-11-18 MED ORDER — LIDOCAINE-EPINEPHRINE 2 %-1:100000 IJ SOLN
20.0000 mL | Freq: Once | INTRAMUSCULAR | Status: DC
  Filled 2013-11-18: qty 1

## 2013-11-18 MED ORDER — CEPHALEXIN 500 MG PO CAPS: 500.0000 mg | ORAL_CAPSULE | Freq: Four times a day (QID) | ORAL | Status: AC

## 2013-11-18 NOTE — ED Notes (Signed)
Pt being sent over from pcp, pt stepped on something and now has foreign body in Left heel. Doctor attempted but unable to remove, did not have sufficient equipment.

## 2013-11-18 NOTE — Discharge Instructions (Signed)
Wood Splinters  Wood splinters need to be removed because they can cause skin irritation and infection. If they are close to the surface, splinters can usually be removed easily. Deep splinters may be hard to locate and need treatment by a surgeon.  SPLINTER REMOVAL  Removal of splinters by your caregiver is considered a surgical procedure.   · The area is carefully cleaned. You may require a small amount of anesthesia (medicine injected near the splinter to numb the tissue and lessen pain). After the splinter is removed, the area will be cleaned again. A bandage is applied.  · If your splinter is under a fingernail or toenail, then a small section of the nail may need to be removed. As long as the splinter did not extend to the base of the nail, the nail usually grows back normally.  · A splinter that is deeper, more contaminated, or that gets near a structure such as a bone, nerve or blood vessel may need to be removed by a surgeon.  · You may need special X-rays or scans if the splinter is hard to locate.  · Every attempt is made to remove the entire splinter. However, small particles may remain. Tell your caregiver if you feel that a part of the splinter was left behind.  HOME CARE INSTRUCTIONS   · Keep the injured area high up (elevated).  · Use the injured area as little as possible.  · Keep the injured area clean and dry. Follow any directions from your caregiver.  · Keep any follow-up or wound check appointments.  You might need a tetanus shot now if:  · You have no idea when you had the last one.  · You have never had a tetanus shot before.  · The injured area had dirt in it.  Even if you have already removed the splinter, call your caregiver to get a tetanus shot if you need one.   If you need a tetanus shot, and you decide not to get one, there is a rare chance of getting tetanus. Sickness from tetanus can be serious. If you did get a tetanus shot, your arm may swell, get red and warm to the touch at the  shot site. This is common and not a problem.  SEEK MEDICAL CARE IF:   · A splinter has been removed, but you are not better in a day or two.  · You develop a temperature.  · Signs of infection develop such as:  ¨ Redness, swelling or pus around the wound.  ¨ Red streaks spreading back from your wound towards your body.  Document Released: 02/10/2004 Document Revised: 05/19/2013 Document Reviewed: 01/13/2008  ExitCare® Patient Information ©2015 [employer]. This information is not intended to replace advice given to you by your health care provider. Make sure you discuss any questions you have with your health care provider.

## 2013-11-18 NOTE — ED Provider Notes (Signed)
CSN: [id number]     Arrival date & time 11/18/13  1609 History   First MD Initiated Contact with Patient 11/18/13 1619     Chief Complaint  Patient presents with  . Foreign Body    left heel     (Consider location/radiation/quality/duration/timing/severity/associated sxs/prior Treatment) Patient is a 80 y.o. male presenting with foreign body.  Foreign Body Location:  Skin Suspected object:  Unable to specify Pain quality:  Aching Pain severity:  Mild Duration:  1 day Timing:  Constant Progression:  Unchanged Chronicity:  New Exacerbated by: bearing weight. Associated symptoms: no abdominal pain, no choking, no cough, no nausea and no vomiting     Past Medical History  Diagnosis Date  . Arthritis   . Cataracts, bilateral    Past Surgical History  Procedure Laterality Date  . Joint replacement      hip  . Rectal surgery      d/t chicken bone getting stuck  . Esophagagastroduodenoscopy    . Cataract extraction w/phaco  11/22/2011    Procedure: CATARACT EXTRACTION PHACO AND INTRAOCULAR LENS PLACEMENT (IOC);  Surgeon: Shade FloodGreer Geiger, MD;  [facility];  Service: Ophthalmology;  Laterality: Right;  . Capsulotomy  02/07/2012    Procedure: MINOR CAPSULOTOMY;  Surgeon: Vita Ermhomas E Brewington Jr., MD;  [facility];  Service: Ophthalmology;  Laterality: Left;  . Yag laser application  02/07/2012    Procedure: YAG LASER APPLICATION;  Surgeon: Vita Ermhomas E Brewington Jr., MD;  [facility];  Service: Ophthalmology;  Laterality: Left;   No family history on file. History  Substance Use Topics  . Smoking status: Never Smoker   . Smokeless tobacco: Not on file  . Alcohol Use: No    Review of Systems  Respiratory: Negative for cough and choking.   Gastrointestinal: Negative for nausea, vomiting and abdominal pain.  All other systems reviewed and are negative.     Allergies  Review of patient's allergies indicates no known allergies.  Home Medications   Prior to Admission  medications   Medication Sig Start Date End Date Taking? Authorizing Provider  Calcium Carbonate (CALCIUM 600 PO) Take 1 tablet by mouth daily.   Yes Historical Provider, MD  cholecalciferol (VITAMIN D) 1000 UNITS tablet Take 1,000 Units by mouth daily.   Yes Historical Provider, MD  Multiple Vitamin (MULTIVITAMIN WITH MINERALS) TABS Take 1 tablet by mouth daily.   Yes Historical Provider, MD  vitamin B-12 (CYANOCOBALAMIN) 1000 MCG tablet Take 1,000 mcg by mouth daily.   Yes Historical Provider, MD   BP 143/62 mmHg  Pulse 62  Temp(Src) 98.2 F (36.8 C) (Oral)  SpO2 98% Physical Exam  Constitutional: He is oriented to person, place, and time. He appears well-developed and well-nourished.  HENT:  Head: Normocephalic and atraumatic.  Eyes: Conjunctivae and EOM are normal.  Neck: Normal range of motion. Neck supple.  Cardiovascular: Normal rate, regular rhythm and normal heart sounds.   Pulmonary/Chest: Effort normal and breath sounds normal. No respiratory distress.  Abdominal: He exhibits no distension. There is no tenderness. There is no rebound and no guarding.  Musculoskeletal: Normal range of motion.  Neurological: He is alert and oriented to person, place, and time.  Skin: Skin is warm and dry.  5 mm puncture to heel of L foot  Vitals reviewed.   ED Course  Procedures (including critical care time) Labs Review Labs Reviewed - No data to display  Imaging Review No results found.   EKG Interpretation None  MDM   Final diagnoses:  Foot pain    80 y.o. male with pertinent PMH of arthritis presents with concern for retained foreign body in foot after he stepped on something this am.  He was seen by his PCP who locally explored the wound but was unable to remove what he felt was a foreign body.  On arrival today vitals signs and physical exam as above. Patient has small puncture wound over the heel of his left foot. Plan to obtain x-ray to localize foreign body in  effort to retrieve it, the patient then elected to leave AGAINST MEDICAL ADVICE.  I discussed infection risk with the patient, however he continued to refuse as he states that he will return in the morning because he cannot drive at night. I informed him to present to the nearest provider, urgent care, or ED. He is given a prescription for antibiotics in the case that he is not able to return first thing in the morning. He was also given standard return precautions for infection. Discharged AGAINST MEDICAL ADVICE.    1. Foot pain         Mirian MoMatthew Gentry, MD 11/18/13 1725

## 2013-11-18 NOTE — ED Notes (Signed)
Pt states that he needs to leave bc he cant drive after dark. Pt states that he will come back tomorrow morning. Kate SableMade Gentry EDP aware.

## 2013-11-18 NOTE — Progress Notes (Signed)
  CARE MANAGEMENT ED NOTE 11/18/2013  Patient:  Joshua Frost,Joshua Frost   Account Number:  [id number]  Date Initiated:  11/18/2013  Documentation initiated by:  Radford PaxFERRERO,Shuna Tabor  Subjective/Objective Assessment:   Patient presents to Ed with with foreign object   in left heel     Subjective/Objective Assessment Detail:     Action/Plan:   Action/Plan Detail:   Anticipated DC Date:       Status Recommendation to Physician:   Result of Recommendation:    Other ED Services  Consult Working Plan    DC Planning Services  Other  PCP issues    Choice offered to / List presented to:            Status of service:  Completed, signed off  ED Comments:   ED Comments Detail:  EDCM spoke to patient ta bedside.  Patient confirms his pcp is Dr. Merri BrunetteWalter Pharr.  System updated.

## 2013-11-19 ENCOUNTER — Emergency Department (HOSPITAL_COMMUNITY)
Admission: EM | Admit: 2013-11-19 | Discharge: 2013-11-19 | Disposition: A | Discharge status: Home / Self Care | Payer: Medicare Other | Attending: Emergency Medicine | Admitting: Emergency Medicine

## 2013-11-19 ENCOUNTER — Encounter (HOSPITAL_COMMUNITY): Payer: Self-pay

## 2013-11-19 ENCOUNTER — Emergency Department (HOSPITAL_COMMUNITY): Payer: Medicare Other

## 2013-11-19 DIAGNOSIS — Y9389 Activity, other specified: Secondary | ICD-10-CM | POA: Insufficient documentation

## 2013-11-19 DIAGNOSIS — S99922A Unspecified injury of left foot, initial encounter: Secondary | ICD-10-CM | POA: Diagnosis present

## 2013-11-19 DIAGNOSIS — R52 Pain, unspecified: Secondary | ICD-10-CM

## 2013-11-19 DIAGNOSIS — Y9289 Other specified places as the place of occurrence of the external cause: Secondary | ICD-10-CM | POA: Insufficient documentation

## 2013-11-19 DIAGNOSIS — Z79899 Other long term (current) drug therapy: Secondary | ICD-10-CM | POA: Diagnosis not present

## 2013-11-19 DIAGNOSIS — W228XXA Striking against or struck by other objects, initial encounter: Secondary | ICD-10-CM | POA: Insufficient documentation

## 2013-11-19 DIAGNOSIS — Z8669 Personal history of other diseases of the nervous system and sense organs: Secondary | ICD-10-CM | POA: Diagnosis not present

## 2013-11-19 DIAGNOSIS — S91332A Puncture wound without foreign body, left foot, initial encounter: Secondary | ICD-10-CM | POA: Diagnosis not present

## 2013-11-19 DIAGNOSIS — Z8739 Personal history of other diseases of the musculoskeletal system and connective tissue: Secondary | ICD-10-CM | POA: Insufficient documentation

## 2013-11-19 NOTE — Discharge Instructions (Signed)
Follow-up with your primary care doctor. Return to the ER if your pain becomes worse, or you notice any redness, swelling, increased pain, fever.   Puncture Wound A puncture wound is an injury that extends through all layers of the skin and into the tissue beneath the skin (subcutaneous tissue). Puncture wounds become infected easily because germs often enter the body and go beneath the skin during the injury. Having a deep wound with a small entrance point makes it difficult for your caregiver to adequately clean the wound. This is especially true if you have stepped on a nail and it has passed through a dirty shoe or other situations where the wound is obviously contaminated. CAUSES  Many puncture wounds involve glass, nails, splinters, fish hooks, or other objects that enter the skin (foreign bodies). A puncture wound may also be caused by a human bite or animal bite. DIAGNOSIS  A puncture wound is usually diagnosed by your history and a physical exam. You may need to have an X-ray or an ultrasound to check for any foreign bodies still in the wound. TREATMENT   Your caregiver will clean the wound as thoroughly as possible. Depending on the location of the wound, a bandage (dressing) may be applied.  Your caregiver might prescribe antibiotic medicines.  You may need a follow-up visit to check on your wound. Follow all instructions as directed by your caregiver. HOME CARE INSTRUCTIONS   Change your dressing once per day, or as directed by your caregiver. If the dressing sticks, it may be removed by soaking the area in water.  If your caregiver has given you follow-up instructions, it is very important that you return for a follow-up appointment. Not following up as directed could result in a chronic or permanent injury, pain, and disability.  Only take over-the-counter or prescription medicines for pain, discomfort, or fever as directed by your caregiver.  If you are given antibiotics, take  them as directed. Finish them even if you start to feel better. You may need a tetanus shot if:  You cannot remember when you had your last tetanus shot.  You have never had a tetanus shot. If you got a tetanus shot, your arm may swell, get red, and feel warm to the touch. This is common and not a problem. If you need a tetanus shot and you choose not to have one, there is a rare chance of getting tetanus. Sickness from tetanus can be serious. You may need a rabies shot if an animal bite caused your puncture wound. SEEK MEDICAL CARE IF:   You have redness, swelling, or increasing pain in the wound.  You have red streaks going away from the wound.  You notice a bad smell coming from the wound or dressing.  You have yellowish-white fluid (pus) coming from the wound.  You are treated with an antibiotic for infection, but the infection is not getting better.  You notice something in the wound, such as rubber from your shoe, cloth, or another object.  You have a fever.  You have severe pain.  You have difficulty breathing.  You feel dizzy or faint.  You cannot stop vomiting.  You lose feeling, develop numbness, or cannot move a limb below the wound.  Your symptoms worsen. MAKE SURE YOU:  Understand these instructions.  Will watch your condition.  Will get help right away if you are not doing well or get worse. Document Released: 10/12/2004 Document Revised: 03/27/2011 Document Reviewed: 06/21/2010 ExitCare Patient Information  2015 [employer]. This information is not intended to replace advice given to you by your health care provider. Make sure you discuss any questions you have with your health care provider. ° °

## 2013-11-19 NOTE — ED Notes (Signed)
Pt stepped on something yesterday.  Has been having pain. Went to primary MD and he attempted to remove object from foot but could not remove.  Pt told to come here.  However, attempted to be seen last night but had to leave b/c he cannot drive at night.

## 2013-11-19 NOTE — ED Provider Notes (Signed)
CSN: [id number]     Arrival date & time 11/19/13  1358 History   First MD Initiated Contact with Patient 11/19/13 1558     Chief Complaint  Patient presents with  . Foot Pain     (Consider location/radiation/quality/duration/timing/severity/associated sxs/prior Treatment) HPI [name] is a 80 year old male with past medical history ofarthritis who presents the ER with complaint of left foot pain. Patient states he is at his PCP yesterday after feeling like he stepped on an object and had a foreign body retained in his heel of his left foot. Patient reports that his PCP office they "dug around in there" attempting to remove the foreign body, however were unable to find or remove any foreign body. Patient was recommended to be seen in the ER after his visit for further evaluation with radiographs. Patient was seen in the ER yesterday, however eloped prior to being able to get radiographs because he stated he could not drive after dark.  Past Medical History  Diagnosis Date  . Arthritis   . Cataracts, bilateral    Past Surgical History  Procedure Laterality Date  . Joint replacement      hip  . Rectal surgery      d/t chicken bone getting stuck  . Esophagagastroduodenoscopy    . Cataract extraction w/phaco  11/22/2011    Procedure: CATARACT EXTRACTION PHACO AND INTRAOCULAR LENS PLACEMENT (IOC);  Surgeon: Shade FloodGreer Geiger, MD;  [facility];  Service: Ophthalmology;  Laterality: Right;  . Capsulotomy  02/07/2012    Procedure: MINOR CAPSULOTOMY;  Surgeon: Vita Ermhomas E Brewington Jr., MD;  [facility];  Service: Ophthalmology;  Laterality: Left;  . Yag laser application  02/07/2012    Procedure: YAG LASER APPLICATION;  Surgeon: Vita Ermhomas E Brewington Jr., MD;  [facility];  Service: Ophthalmology;  Laterality: Left;   History reviewed. No pertinent family history. History  Substance Use Topics  . Smoking status: Never Smoker   . Smokeless tobacco: Not on file  . Alcohol Use: No     Review of Systems  Skin: Positive for wound.  Neurological: Negative for numbness.      Allergies  Review of patient's allergies indicates no known allergies.  Home Medications   Prior to Admission medications   Medication Sig Start Date End Date Taking? Authorizing Provider  Calcium Carbonate (CALCIUM 600 PO) Take 1 tablet by mouth daily.   Yes Historical Provider, MD  cholecalciferol (VITAMIN D) 1000 UNITS tablet Take 1,000 Units by mouth daily.   Yes Historical Provider, MD  Multiple Vitamin (MULTIVITAMIN WITH MINERALS) TABS Take 1 tablet by mouth daily.   Yes Historical Provider, MD  vitamin B-12 (CYANOCOBALAMIN) 1000 MCG tablet Take 1,000 mcg by mouth daily.   Yes Historical Provider, MD  cephALEXin (KEFLEX) 500 MG capsule Take 1 capsule (500 mg total) by mouth 4 (four) times daily. 11/18/13   Mirian MoMatthew Gentry, MD   BP 168/73 mmHg  Pulse 56  Temp(Src) 97.9 F (36.6 C) (Oral)  Resp 16  SpO2 100% Physical Exam  Constitutional: He is oriented to person, place, and time. He appears well-developed and well-nourished. No distress.  HENT:  Head: Normocephalic and atraumatic.  Eyes: Right eye exhibits no discharge. Left eye exhibits no discharge. No scleral icterus.  Neck: Normal range of motion.  Pulmonary/Chest: Effort normal. No respiratory distress.  Musculoskeletal: Normal range of motion.  Neurological: He is alert and oriented to person, place, and time.  Skin: Skin is warm and dry. He is not diaphoretic.  2-3 mm dry, healing scab noted on the heel of patient's left foot. No surrounding erythema, edema, induration, swelling, warmth, signs of infection.  Psychiatric: He has a normal mood and affect.  Nursing note and vitals reviewed.   ED Course  Procedures (including critical care time) Labs Review Labs Reviewed - No data to display  Imaging Review Dg Foot 2 Views Left  11/19/2013   CLINICAL DATA:  80 year old male who stepped on a foreign body (not otherwise  specified) yesterday, unable to be removed at Healthcare visit yesterday. Initial encounter.  EXAM: LEFT FOOT - 2 VIEW  COMPARISON:  None.  FINDINGS: No radiopaque foreign body identified. No subcutaneous gas identified. Bone mineralization is within normal limits for age. Calcaneus intact. Mild for age joint degeneration. No acute fracture or dislocation identified.  IMPRESSION: No radiopaque foreign body identified. No acute osseous abnormality identified.   Electronically Signed   By: Augusto GambleLee  Hall M.D.   On: 11/19/2013 14:43     EKG Interpretation None      MDM   Final diagnoses:  Penetrating foot wound, left, initial encounter   Patient here complaining of follow-up from possible penetrating trauma to his heel of his left foot. Patient seen his PCP who attempted a debridement and foreign body removal of his foot, however is unable to obtain radiographs, and requested patient be seen in the ER yesterday for further evaluation. Patient left AMA yesterday by her to obtaining radiographs stating that he was unable to drive after dark. Patient returned today and we obtained radiographs. Radiology read shows impression of no radiopaque foreign bodies noted in patient's heel. I was able to appreciate a miniscule area of radio opaqueness in patient's heel on radiographs which was consistent with the area of where patient's pain and hematoma was. I discussed with patient that this could represent either a foreign body or possibly callous formation or the superficial scab associated. Patient allowed me to debridement the small wound again. Wound was debridement, and swept for foreign bodies, without any obvious foreign body removal. Patient stating that his pain in his foot has decreased since yesterday, and he is able to walk on it better today. Patient stating that it is much more comfortable than yesterday, and he has not noted any swelling, redness, drainage. Patient states that yesterday his wound was  painful to walk on, however today he is able to walk pain free. Patient was discharged with instruction to follow up with Dr. Norma FredricksonFarr. I discussed return precautions with patient, and encourage him to call or return to the ER should he have any change in his condition or should he have any questions or concerns. Patient was agreeable to this plan.  BP 168/73 mmHg  Pulse 56  Temp(Src) 97.9 F (36.6 C) (Oral)  Resp 16  SpO2 100%  Signed,  Ladona MowJoe Allaya Abbasi, PA-C 2:00 AM  This patient seen and discussed with Dr. Jerelyn ScottMartha Linker, M.D.    Monte FantasiaJoseph W Nadege Carriger, PA-C 11/20/13 0200  Ethelda ChickMartha K Linker, MD 11/20/13 [phone]

## 2013-11-23 ENCOUNTER — Encounter (HOSPITAL_COMMUNITY): Payer: Self-pay

## 2013-11-23 ENCOUNTER — Emergency Department (INDEPENDENT_AMBULATORY_CARE_PROVIDER_SITE_OTHER)
Admission: EM | Admit: 2013-11-23 | Discharge: 2013-11-23 | Disposition: A | Discharge status: Home / Self Care | Payer: Medicare Other | Source: Home / Self Care | Attending: Family Medicine | Admitting: Family Medicine

## 2013-11-23 DIAGNOSIS — L089 Local infection of the skin and subcutaneous tissue, unspecified: Secondary | ICD-10-CM

## 2013-11-23 DIAGNOSIS — L723 Sebaceous cyst: Secondary | ICD-10-CM

## 2013-11-23 MED ORDER — SULFAMETHOXAZOLE-TRIMETHOPRIM 800-160 MG PO TABS: 1.0000 | ORAL_TABLET | Freq: Two times a day (BID) | ORAL | Status: AC

## 2013-11-23 MED ORDER — LIDOCAINE-EPINEPHRINE (PF) 2 %-1:200000 IJ SOLN
INTRAMUSCULAR | Status: AC
  Filled 2013-11-23: qty 20

## 2013-11-23 NOTE — ED Notes (Signed)
Patient states has a abscess on the right side of his neck that has Gotten progressively larger. Patient states that last night it started oozing Some puss and blood. Today the area around it is red and inflamed

## 2013-11-23 NOTE — Discharge Instructions (Signed)
Remove the packing in 4 days.  Follow up with the general surgeon    Epidermal Cyst An epidermal cyst is sometimes called a sebaceous cyst, epidermal inclusion cyst, or infundibular cyst. These cysts usually contain a substance that looks "pasty" or "cheesy" and may have a bad smell. This substance is a protein called keratin. Epidermal cysts are usually found on the face, neck, or trunk. They may also occur in the vaginal area or other parts of the genitalia of both men and women. Epidermal cysts are usually small, painless, slow-growing bumps or lumps that move freely under the skin. It is important not to try to pop them. This may cause an infection and lead to tenderness and swelling. CAUSES  Epidermal cysts may be caused by a deep penetrating injury to the skin or a plugged hair follicle, often associated with acne. SYMPTOMS  Epidermal cysts can become inflamed and cause:  Redness.  Tenderness.  Increased temperature of the skin over the bumps or lumps.  Grayish-white, bad smelling material that drains from the bump or lump. DIAGNOSIS  Epidermal cysts are easily diagnosed by your caregiver during an exam. Rarely, a tissue sample (biopsy) may be taken to rule out other conditions that may resemble epidermal cysts. TREATMENT   Epidermal cysts often get better and disappear on their own. They are rarely ever cancerous.  If a cyst becomes infected, it may become inflamed and tender. This may require opening and draining the cyst. Treatment with antibiotics may be necessary. When the infection is gone, the cyst may be removed with minor surgery.  Small, inflamed cysts can often be treated with antibiotics or by injecting steroid medicines.  Sometimes, epidermal cysts become large and bothersome. If this happens, surgical removal in your caregiver's office may be necessary. HOME CARE INSTRUCTIONS  Only take over-the-counter or prescription medicines as directed by your caregiver.  Take  your antibiotics as directed. Finish them even if you start to feel better. SEEK MEDICAL CARE IF:   Your cyst becomes tender, red, or swollen.  Your condition is not improving or is getting worse.  You have any other questions or concerns. MAKE SURE YOU:  Understand these instructions.  Will watch your condition.  Will get help right away if you are not doing well or get worse. Document Released: 12/04/2003 Document Revised: 03/27/2011 Document Reviewed: 07/11/2010 [facility] Patient Information 2015 [place], [place]. This information is not intended to replace advice given to you by your health care provider. Make sure you discuss any questions you have with your health care provider.

## 2013-11-23 NOTE — ED Provider Notes (Signed)
CSN: [id number]     Arrival date & time 11/23/13  1339 History   First MD Initiated Contact with Patient 11/23/13 1400     Chief Complaint  Patient presents with  . Abscess   (Consider location/radiation/quality/duration/timing/severity/associated sxs/prior Treatment) HPI           80 year old male presents complaining of a cyst on the right side of his neck that is draining blood and pus.  He has had this cyst on the right side of his neck for years, getting gradually larger. A few days ago it started to get red and sore. It has been throbbing. Last night started to drain pus and blood. The soreness has gotten better since it drained. No systemic symptoms. Denies fever or chills, NVD, headache, neck stiffness.  Past Medical History  Diagnosis Date  . Arthritis   . Cataracts, bilateral    Past Surgical History  Procedure Laterality Date  . Joint replacement      hip  . Rectal surgery      d/t chicken bone getting stuck  . Esophagagastroduodenoscopy    . Cataract extraction w/phaco  11/22/2011    Procedure: CATARACT EXTRACTION PHACO AND INTRAOCULAR LENS PLACEMENT (IOC);  Surgeon: Shade FloodGreer Geiger, MD;  [facility];  Service: Ophthalmology;  Laterality: Right;  . Capsulotomy  02/07/2012    Procedure: MINOR CAPSULOTOMY;  Surgeon: Vita Ermhomas E Brewington Jr., MD;  [facility];  Service: Ophthalmology;  Laterality: Left;  . Yag laser application  02/07/2012    Procedure: YAG LASER APPLICATION;  Surgeon: Vita Ermhomas E Brewington Jr., MD;  [facility];  Service: Ophthalmology;  Laterality: Left;   No family history on file. History  Substance Use Topics  . Smoking status: Never Smoker   . Smokeless tobacco: Not on file  . Alcohol Use: No    Review of Systems  Musculoskeletal: Positive for neck pain (see HPI).  All other systems reviewed and are negative.   Allergies  Review of patient's allergies indicates no known allergies.  Home Medications   Prior to Admission medications     Medication Sig Start Date End Date Taking? Authorizing Provider  Calcium Carbonate (CALCIUM 600 PO) Take 1 tablet by mouth daily.    Historical Provider, MD  cephALEXin (KEFLEX) 500 MG capsule Take 1 capsule (500 mg total) by mouth 4 (four) times daily. 11/18/13   Mirian MoMatthew Gentry, MD  cholecalciferol (VITAMIN D) 1000 UNITS tablet Take 1,000 Units by mouth daily.    Historical Provider, MD  Multiple Vitamin (MULTIVITAMIN WITH MINERALS) TABS Take 1 tablet by mouth daily.    Historical Provider, MD  sulfamethoxazole-trimethoprim (SEPTRA DS) 800-160 MG per tablet Take 1 tablet by mouth every 12 (twelve) hours. 11/23/13   Adrian BlackwaterZachary H Rossana Molchan, PA-C  vitamin B-12 (CYANOCOBALAMIN) 1000 MCG tablet Take 1,000 mcg by mouth daily.    Historical Provider, MD   BP 155/95 mmHg  Pulse 67  Temp(Src) 98.7 F (37.1 C) (Oral)  Resp 16  SpO2 100% Physical Exam  Constitutional: He is oriented to person, place, and time. He appears well-developed and well-nourished. No distress.  HENT:  Head: Normocephalic.  Neck: Full passive range of motion without pain.    Pulmonary/Chest: Effort normal. No respiratory distress.  Neurological: He is alert and oriented to person, place, and time. Coordination normal.  Skin: Skin is warm and dry. No rash noted. He is not diaphoretic.  Psychiatric: He has a normal mood and affect. Judgment normal.  Nursing note and vitals reviewed.  ED Course  INCISION AND DRAINAGE Date/Time: 11/23/2013 6:11 PM Performed by: Autumn MessingBAKER, Sharanda Shinault, H Authorized by: Bradd CanaryKINDL, JAMES D Consent: Verbal consent obtained. Risks and benefits: risks, benefits and alternatives were discussed Consent given by: patient Patient identity confirmed: verbally with patient Time out: Immediately prior to procedure a "time out" was called to verify the correct patient, procedure, equipment, support staff and site/side marked as required. Type: cyst Body area: head/neck Location details: neck Anesthesia: local  infiltration Local anesthetic: lidocaine 2% with epinephrine Anesthetic total: 2 ml Scalpel size: 11 Incision type: single straight Complexity: simple Drainage: purulent and  bloody (as well as thick sebaceous material) Drainage amount: copious Wound treatment: drain placed Packing material: 1/4 in iodoform gauze Patient tolerance: Patient tolerated the procedure well with no immediate complications   (including critical care time) Labs Review Labs Reviewed  CULTURE, ROUTINE-ABSCESS    Imaging Review No results found.   MDM   1. Infected sebaceous cyst    There was some purulent drainage, followed by a large amount of thick sebaceous, malodorous material. Advised him that the cyst will most likely return and that he may follow-up with general surgery to have it removed. We'll prescribe Bactrim for the infection. Culture sent. He should leave the packing in for 4 days, and then continue with warm compresses. Follow-up when necessary. Return precautions discussed with the patient   Meds ordered this encounter  Medications  . sulfamethoxazole-trimethoprim (SEPTRA DS) 800-160 MG per tablet    Sig: Take 1 tablet by mouth every 12 (twelve) hours.    Dispense:  14 tablet    Refill:  0    Order Specific Question:  Supervising Provider    Answer:  Bradd CanaryKINDL, JAMES D [[id number]]       Graylon GoodZachary H Zakary Kimura, PA-C 11/23/13 [phone]

## 2013-11-27 LAB — CULTURE, ROUTINE-ABSCESS

## 2014-01-30 ENCOUNTER — Other Ambulatory Visit: Payer: Self-pay | Admitting: General Surgery

## 2015-07-29 IMAGING — CR DG FOOT 2V*L*
2 series · 2 of 2 positions shown · non-contrast
Comparison: None.

CLINICAL DATA: [AGE] male who stepped on a foreign body (not
otherwise specified) yesterday, unable to be removed at Healthcare
visit yesterday. Initial encounter.

EXAM:
LEFT FOOT - 2 VIEW

[x foot ap left]
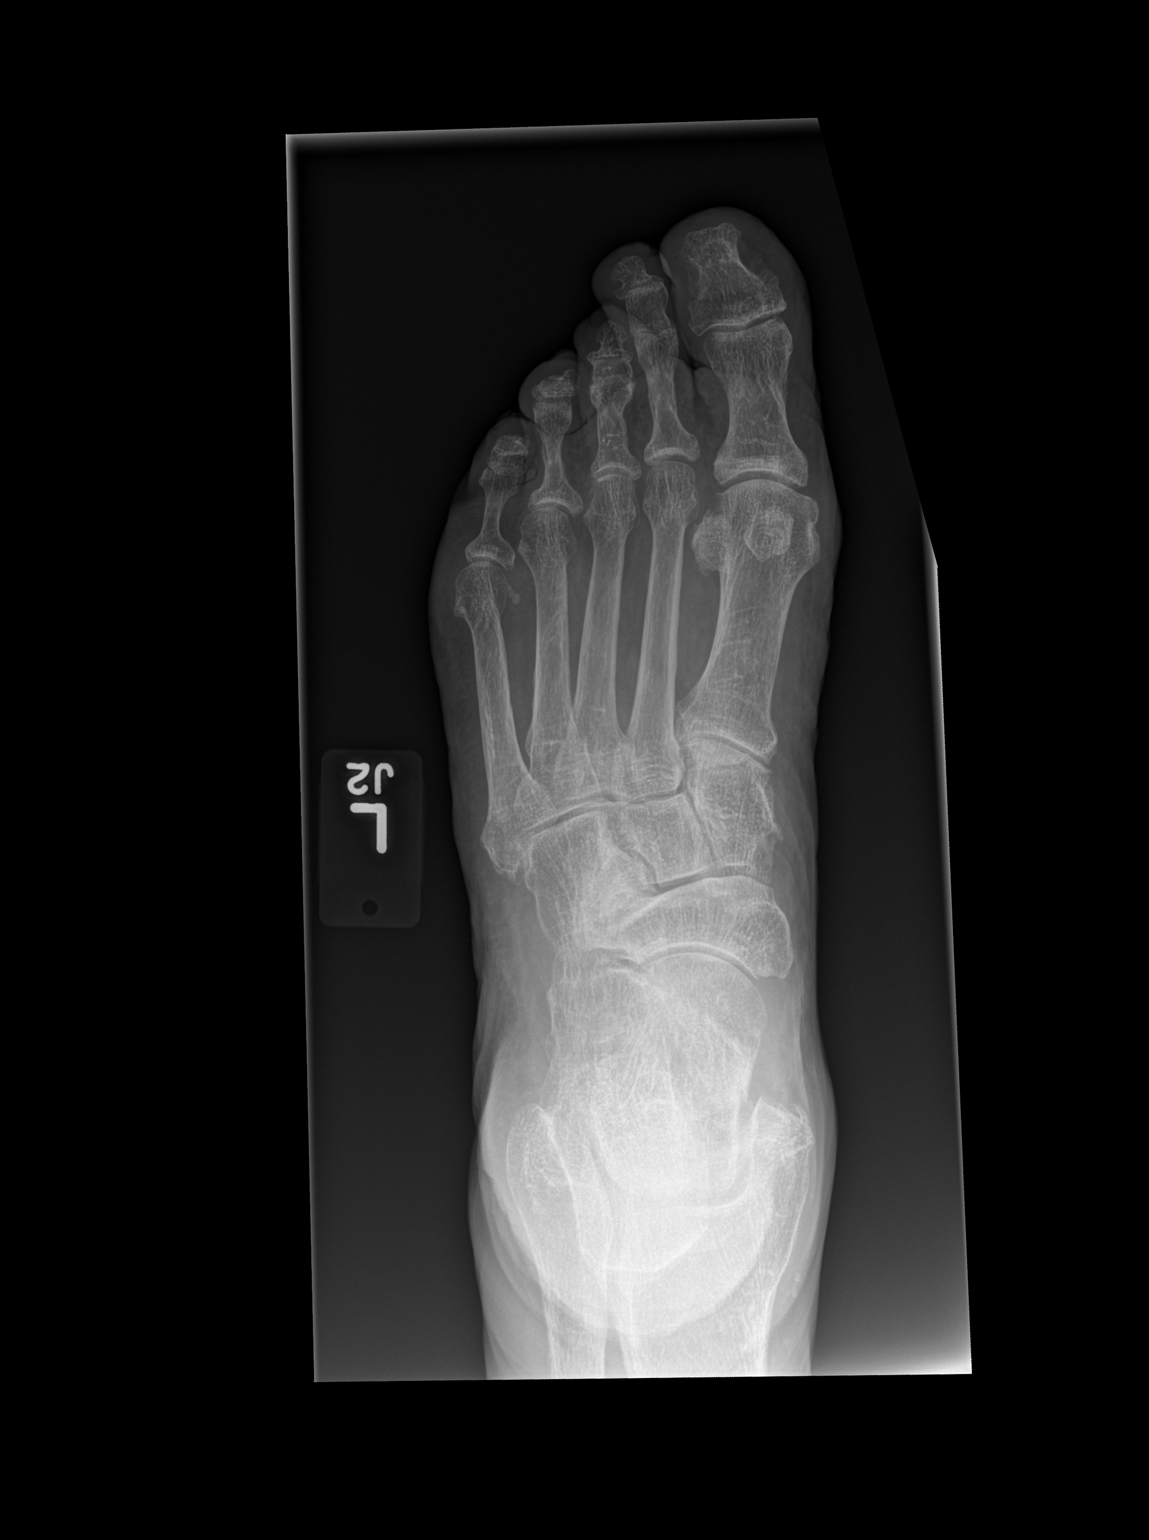

[x foot lat left]
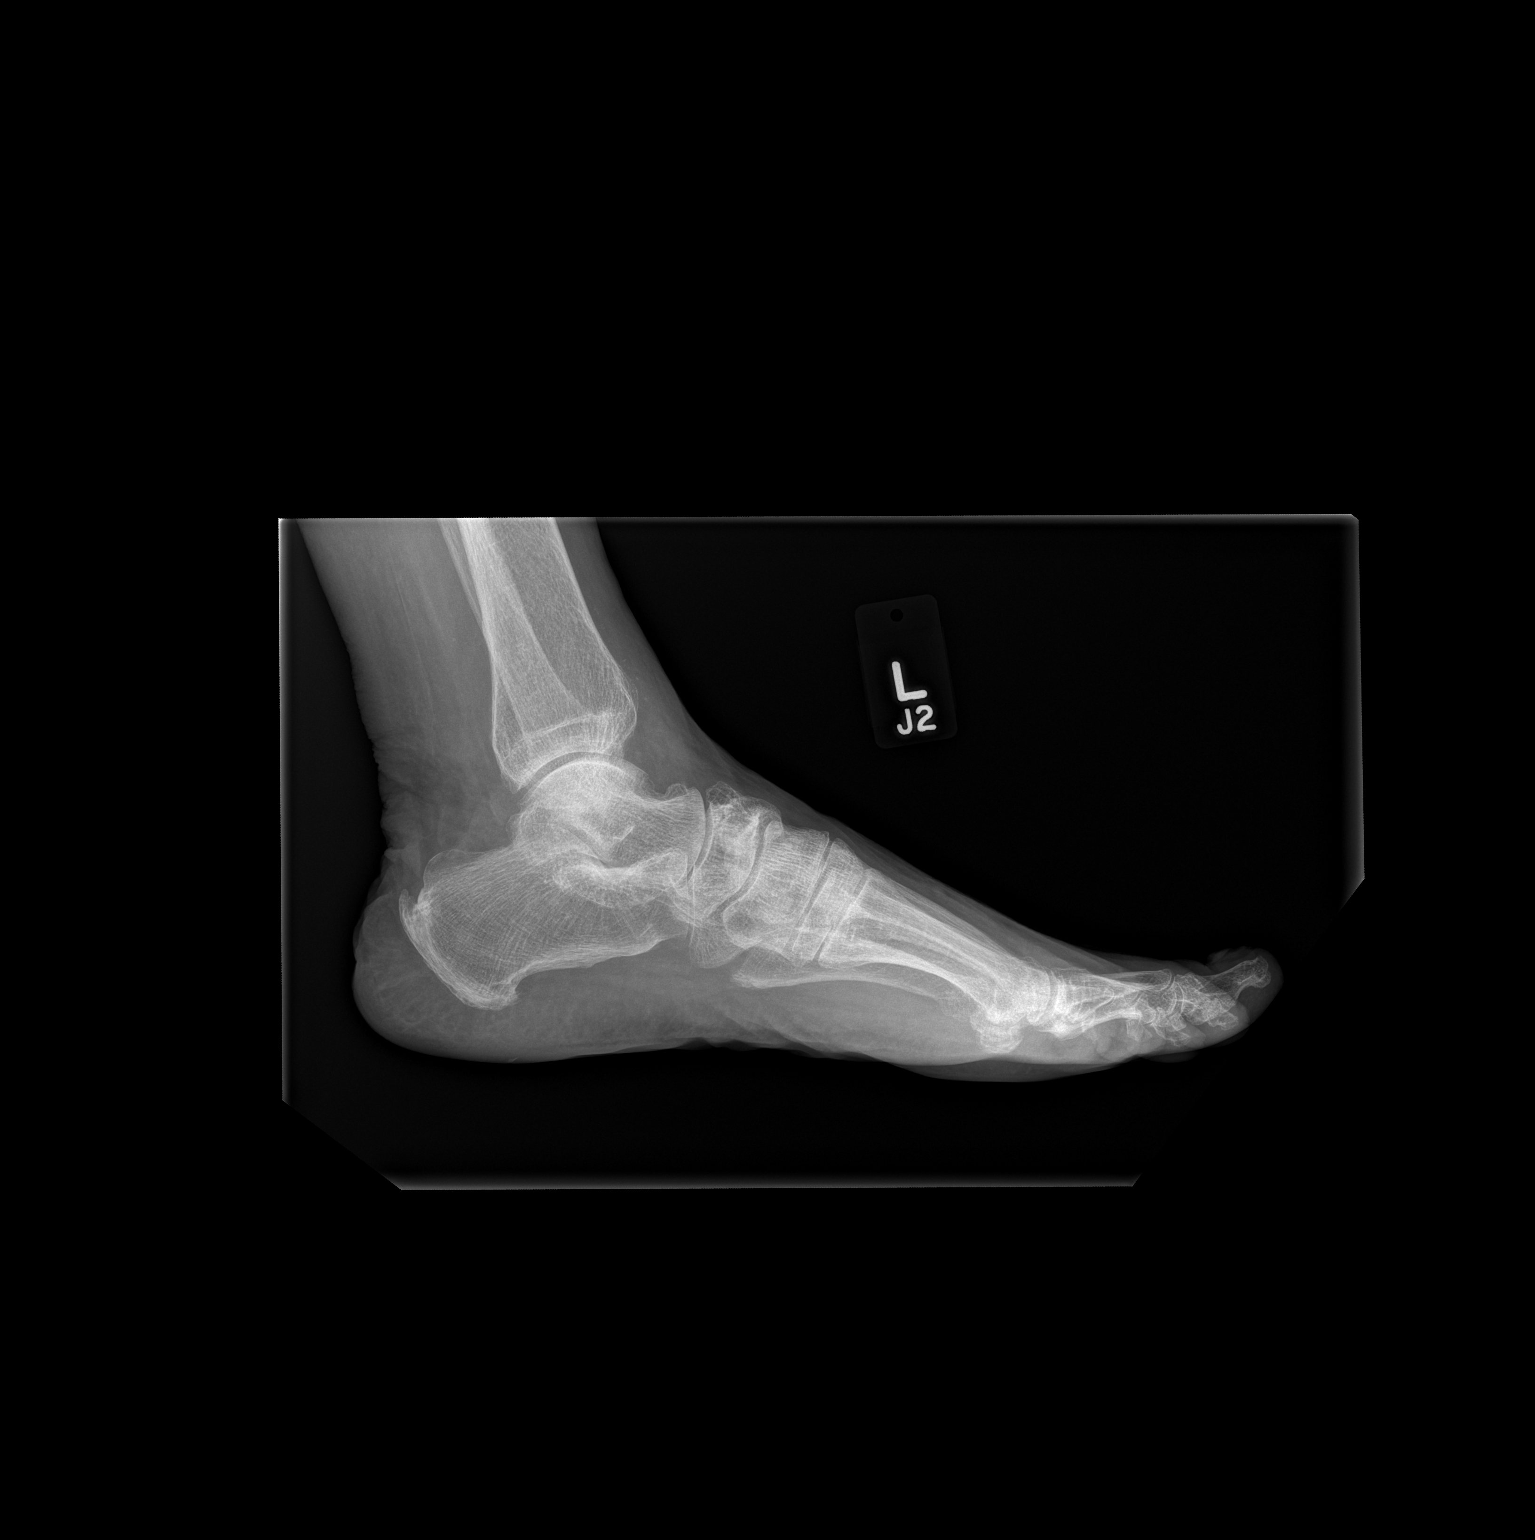

[2 of 2 positions shown; findings below may reference images not displayed]

FINDINGS: No radiopaque foreign body identified. No subcutaneous gas
identified. Bone mineralization is within normal limits for age.
Calcaneus intact. Mild for age joint degeneration. No acute fracture
or dislocation identified.
IMPRESSION: No radiopaque foreign body identified. No acute osseous abnormality
identified.

## 2016-04-12 DIAGNOSIS — W19XXXA Unspecified fall, initial encounter: Secondary | ICD-10-CM | POA: Insufficient documentation

## 2017-09-20 ENCOUNTER — Ambulatory Visit: Payer: Self-pay | Admitting: Podiatry

## 2017-09-20 ENCOUNTER — Other Ambulatory Visit: Payer: Self-pay

## 2019-01-22 DIAGNOSIS — M9701XD Periprosthetic fracture around internal prosthetic right hip joint, subsequent encounter: Secondary | ICD-10-CM | POA: Diagnosis not present

## 2019-01-22 DIAGNOSIS — Z96641 Presence of right artificial hip joint: Secondary | ICD-10-CM | POA: Diagnosis not present

## 2019-01-22 DIAGNOSIS — M80051D Age-related osteoporosis with current pathological fracture, right femur, subsequent encounter for fracture with routine healing: Secondary | ICD-10-CM | POA: Diagnosis not present

## 2019-01-22 DIAGNOSIS — W19XXXD Unspecified fall, subsequent encounter: Secondary | ICD-10-CM | POA: Diagnosis not present

## 2019-01-22 DIAGNOSIS — D62 Acute posthemorrhagic anemia: Secondary | ICD-10-CM | POA: Diagnosis not present

## 2019-01-22 DIAGNOSIS — Z9181 History of falling: Secondary | ICD-10-CM | POA: Diagnosis not present

## 2019-01-22 DIAGNOSIS — L89322 Pressure ulcer of left buttock, stage 2: Secondary | ICD-10-CM | POA: Diagnosis not present

## 2019-01-23 DIAGNOSIS — D62 Acute posthemorrhagic anemia: Secondary | ICD-10-CM | POA: Diagnosis not present

## 2019-01-23 DIAGNOSIS — L8940 Pressure ulcer of contiguous site of back, buttock and hip, unspecified stage: Secondary | ICD-10-CM | POA: Diagnosis not present

## 2019-01-23 DIAGNOSIS — M6281 Muscle weakness (generalized): Secondary | ICD-10-CM | POA: Diagnosis not present

## 2019-01-23 DIAGNOSIS — Z09 Encounter for follow-up examination after completed treatment for conditions other than malignant neoplasm: Secondary | ICD-10-CM | POA: Diagnosis not present

## 2019-01-23 DIAGNOSIS — M4322 Fusion of spine, cervical region: Secondary | ICD-10-CM | POA: Diagnosis not present

## 2019-01-23 DIAGNOSIS — S12301D Unspecified nondisplaced fracture of fourth cervical vertebra, subsequent encounter for fracture with routine healing: Secondary | ICD-10-CM | POA: Diagnosis not present

## 2019-01-23 DIAGNOSIS — Z8781 Personal history of (healed) traumatic fracture: Secondary | ICD-10-CM | POA: Diagnosis not present

## 2019-01-24 DIAGNOSIS — L89322 Pressure ulcer of left buttock, stage 2: Secondary | ICD-10-CM | POA: Diagnosis not present

## 2019-01-24 DIAGNOSIS — D62 Acute posthemorrhagic anemia: Secondary | ICD-10-CM | POA: Diagnosis not present

## 2019-01-24 DIAGNOSIS — W19XXXD Unspecified fall, subsequent encounter: Secondary | ICD-10-CM | POA: Diagnosis not present

## 2019-01-24 DIAGNOSIS — M9701XD Periprosthetic fracture around internal prosthetic right hip joint, subsequent encounter: Secondary | ICD-10-CM | POA: Diagnosis not present

## 2019-01-24 DIAGNOSIS — M80051D Age-related osteoporosis with current pathological fracture, right femur, subsequent encounter for fracture with routine healing: Secondary | ICD-10-CM | POA: Diagnosis not present

## 2019-01-24 DIAGNOSIS — Z96641 Presence of right artificial hip joint: Secondary | ICD-10-CM | POA: Diagnosis not present

## 2019-01-24 DIAGNOSIS — Z9181 History of falling: Secondary | ICD-10-CM | POA: Diagnosis not present

## 2019-01-28 DIAGNOSIS — Z8781 Personal history of (healed) traumatic fracture: Secondary | ICD-10-CM | POA: Diagnosis not present

## 2019-01-28 DIAGNOSIS — M978XXD Periprosthetic fracture around other internal prosthetic joint, subsequent encounter: Secondary | ICD-10-CM | POA: Diagnosis not present

## 2019-01-28 DIAGNOSIS — Z9889 Other specified postprocedural states: Secondary | ICD-10-CM | POA: Diagnosis not present

## 2019-01-28 DIAGNOSIS — Z4789 Encounter for other orthopedic aftercare: Secondary | ICD-10-CM | POA: Diagnosis not present

## 2019-01-29 DIAGNOSIS — L89322 Pressure ulcer of left buttock, stage 2: Secondary | ICD-10-CM | POA: Diagnosis not present

## 2019-01-29 DIAGNOSIS — M80051D Age-related osteoporosis with current pathological fracture, right femur, subsequent encounter for fracture with routine healing: Secondary | ICD-10-CM | POA: Diagnosis not present

## 2019-01-29 DIAGNOSIS — W19XXXD Unspecified fall, subsequent encounter: Secondary | ICD-10-CM | POA: Diagnosis not present

## 2019-01-29 DIAGNOSIS — Z96641 Presence of right artificial hip joint: Secondary | ICD-10-CM | POA: Diagnosis not present

## 2019-01-29 DIAGNOSIS — Z9181 History of falling: Secondary | ICD-10-CM | POA: Diagnosis not present

## 2019-01-29 DIAGNOSIS — M9701XD Periprosthetic fracture around internal prosthetic right hip joint, subsequent encounter: Secondary | ICD-10-CM | POA: Diagnosis not present

## 2019-01-29 DIAGNOSIS — D62 Acute posthemorrhagic anemia: Secondary | ICD-10-CM | POA: Diagnosis not present

## 2019-01-30 DIAGNOSIS — M9701XD Periprosthetic fracture around internal prosthetic right hip joint, subsequent encounter: Secondary | ICD-10-CM | POA: Diagnosis not present

## 2019-01-30 DIAGNOSIS — L89322 Pressure ulcer of left buttock, stage 2: Secondary | ICD-10-CM | POA: Diagnosis not present

## 2019-01-30 DIAGNOSIS — M80051D Age-related osteoporosis with current pathological fracture, right femur, subsequent encounter for fracture with routine healing: Secondary | ICD-10-CM | POA: Diagnosis not present

## 2019-01-30 DIAGNOSIS — Z96641 Presence of right artificial hip joint: Secondary | ICD-10-CM | POA: Diagnosis not present

## 2019-01-30 DIAGNOSIS — D62 Acute posthemorrhagic anemia: Secondary | ICD-10-CM | POA: Diagnosis not present

## 2019-01-30 DIAGNOSIS — Z9181 History of falling: Secondary | ICD-10-CM | POA: Diagnosis not present

## 2019-01-30 DIAGNOSIS — W19XXXD Unspecified fall, subsequent encounter: Secondary | ICD-10-CM | POA: Diagnosis not present

## 2019-02-05 DIAGNOSIS — W19XXXD Unspecified fall, subsequent encounter: Secondary | ICD-10-CM | POA: Diagnosis not present

## 2019-02-05 DIAGNOSIS — M80051D Age-related osteoporosis with current pathological fracture, right femur, subsequent encounter for fracture with routine healing: Secondary | ICD-10-CM | POA: Diagnosis not present

## 2019-02-05 DIAGNOSIS — M9701XD Periprosthetic fracture around internal prosthetic right hip joint, subsequent encounter: Secondary | ICD-10-CM | POA: Diagnosis not present

## 2019-02-05 DIAGNOSIS — L89322 Pressure ulcer of left buttock, stage 2: Secondary | ICD-10-CM | POA: Diagnosis not present

## 2019-02-05 DIAGNOSIS — D62 Acute posthemorrhagic anemia: Secondary | ICD-10-CM | POA: Diagnosis not present

## 2019-02-05 DIAGNOSIS — Z9181 History of falling: Secondary | ICD-10-CM | POA: Diagnosis not present

## 2019-02-05 DIAGNOSIS — Z96641 Presence of right artificial hip joint: Secondary | ICD-10-CM | POA: Diagnosis not present

## 2019-02-06 DIAGNOSIS — I998 Other disorder of circulatory system: Secondary | ICD-10-CM | POA: Diagnosis not present

## 2019-02-06 DIAGNOSIS — Z9181 History of falling: Secondary | ICD-10-CM | POA: Diagnosis not present

## 2019-02-06 DIAGNOSIS — Z96641 Presence of right artificial hip joint: Secondary | ICD-10-CM | POA: Diagnosis not present

## 2019-02-06 DIAGNOSIS — M8588 Other specified disorders of bone density and structure, other site: Secondary | ICD-10-CM | POA: Diagnosis not present

## 2019-02-06 DIAGNOSIS — M978XXD Periprosthetic fracture around other internal prosthetic joint, subsequent encounter: Secondary | ICD-10-CM | POA: Diagnosis not present

## 2019-02-06 DIAGNOSIS — Z96649 Presence of unspecified artificial hip joint: Secondary | ICD-10-CM | POA: Diagnosis not present

## 2019-02-06 DIAGNOSIS — M810 Age-related osteoporosis without current pathological fracture: Secondary | ICD-10-CM | POA: Diagnosis not present

## 2019-02-06 DIAGNOSIS — M25561 Pain in right knee: Secondary | ICD-10-CM | POA: Diagnosis not present

## 2019-02-06 DIAGNOSIS — Z471 Aftercare following joint replacement surgery: Secondary | ICD-10-CM | POA: Diagnosis not present

## 2019-02-07 DIAGNOSIS — D62 Acute posthemorrhagic anemia: Secondary | ICD-10-CM | POA: Diagnosis not present

## 2019-02-07 DIAGNOSIS — L89322 Pressure ulcer of left buttock, stage 2: Secondary | ICD-10-CM | POA: Diagnosis not present

## 2019-02-07 DIAGNOSIS — M80051D Age-related osteoporosis with current pathological fracture, right femur, subsequent encounter for fracture with routine healing: Secondary | ICD-10-CM | POA: Diagnosis not present

## 2019-02-07 DIAGNOSIS — M9701XD Periprosthetic fracture around internal prosthetic right hip joint, subsequent encounter: Secondary | ICD-10-CM | POA: Diagnosis not present

## 2019-02-07 DIAGNOSIS — Z9181 History of falling: Secondary | ICD-10-CM | POA: Diagnosis not present

## 2019-02-07 DIAGNOSIS — W19XXXD Unspecified fall, subsequent encounter: Secondary | ICD-10-CM | POA: Diagnosis not present

## 2019-02-07 DIAGNOSIS — Z96641 Presence of right artificial hip joint: Secondary | ICD-10-CM | POA: Diagnosis not present

## 2019-02-10 DIAGNOSIS — L89322 Pressure ulcer of left buttock, stage 2: Secondary | ICD-10-CM | POA: Diagnosis not present

## 2019-02-10 DIAGNOSIS — W19XXXD Unspecified fall, subsequent encounter: Secondary | ICD-10-CM | POA: Diagnosis not present

## 2019-02-10 DIAGNOSIS — M80051D Age-related osteoporosis with current pathological fracture, right femur, subsequent encounter for fracture with routine healing: Secondary | ICD-10-CM | POA: Diagnosis not present

## 2019-02-10 DIAGNOSIS — Z9181 History of falling: Secondary | ICD-10-CM | POA: Diagnosis not present

## 2019-02-10 DIAGNOSIS — D62 Acute posthemorrhagic anemia: Secondary | ICD-10-CM | POA: Diagnosis not present

## 2019-02-10 DIAGNOSIS — M9701XD Periprosthetic fracture around internal prosthetic right hip joint, subsequent encounter: Secondary | ICD-10-CM | POA: Diagnosis not present

## 2019-02-10 DIAGNOSIS — Z96641 Presence of right artificial hip joint: Secondary | ICD-10-CM | POA: Diagnosis not present

## 2019-02-13 DIAGNOSIS — W19XXXD Unspecified fall, subsequent encounter: Secondary | ICD-10-CM | POA: Diagnosis not present

## 2019-02-13 DIAGNOSIS — L89322 Pressure ulcer of left buttock, stage 2: Secondary | ICD-10-CM | POA: Diagnosis not present

## 2019-02-13 DIAGNOSIS — M80051D Age-related osteoporosis with current pathological fracture, right femur, subsequent encounter for fracture with routine healing: Secondary | ICD-10-CM | POA: Diagnosis not present

## 2019-02-13 DIAGNOSIS — Z9181 History of falling: Secondary | ICD-10-CM | POA: Diagnosis not present

## 2019-02-13 DIAGNOSIS — D62 Acute posthemorrhagic anemia: Secondary | ICD-10-CM | POA: Diagnosis not present

## 2019-02-13 DIAGNOSIS — M9701XD Periprosthetic fracture around internal prosthetic right hip joint, subsequent encounter: Secondary | ICD-10-CM | POA: Diagnosis not present

## 2019-02-13 DIAGNOSIS — Z96641 Presence of right artificial hip joint: Secondary | ICD-10-CM | POA: Diagnosis not present

## 2019-02-17 DIAGNOSIS — W19XXXD Unspecified fall, subsequent encounter: Secondary | ICD-10-CM | POA: Diagnosis not present

## 2019-02-17 DIAGNOSIS — M9701XD Periprosthetic fracture around internal prosthetic right hip joint, subsequent encounter: Secondary | ICD-10-CM | POA: Diagnosis not present

## 2019-02-17 DIAGNOSIS — Z96641 Presence of right artificial hip joint: Secondary | ICD-10-CM | POA: Diagnosis not present

## 2019-02-17 DIAGNOSIS — L89322 Pressure ulcer of left buttock, stage 2: Secondary | ICD-10-CM | POA: Diagnosis not present

## 2019-02-17 DIAGNOSIS — M80051D Age-related osteoporosis with current pathological fracture, right femur, subsequent encounter for fracture with routine healing: Secondary | ICD-10-CM | POA: Diagnosis not present

## 2019-02-17 DIAGNOSIS — Z9181 History of falling: Secondary | ICD-10-CM | POA: Diagnosis not present

## 2019-02-17 DIAGNOSIS — D62 Acute posthemorrhagic anemia: Secondary | ICD-10-CM | POA: Diagnosis not present

## 2019-02-18 DIAGNOSIS — M978XXD Periprosthetic fracture around other internal prosthetic joint, subsequent encounter: Secondary | ICD-10-CM | POA: Diagnosis not present

## 2019-02-18 DIAGNOSIS — Z96649 Presence of unspecified artificial hip joint: Secondary | ICD-10-CM | POA: Diagnosis not present

## 2019-02-18 DIAGNOSIS — Z8781 Personal history of (healed) traumatic fracture: Secondary | ICD-10-CM | POA: Diagnosis not present

## 2019-02-19 DIAGNOSIS — L89322 Pressure ulcer of left buttock, stage 2: Secondary | ICD-10-CM | POA: Diagnosis not present

## 2019-02-19 DIAGNOSIS — Z9181 History of falling: Secondary | ICD-10-CM | POA: Diagnosis not present

## 2019-02-19 DIAGNOSIS — M9701XD Periprosthetic fracture around internal prosthetic right hip joint, subsequent encounter: Secondary | ICD-10-CM | POA: Diagnosis not present

## 2019-02-19 DIAGNOSIS — Z96641 Presence of right artificial hip joint: Secondary | ICD-10-CM | POA: Diagnosis not present

## 2019-02-19 DIAGNOSIS — D62 Acute posthemorrhagic anemia: Secondary | ICD-10-CM | POA: Diagnosis not present

## 2019-02-19 DIAGNOSIS — W19XXXD Unspecified fall, subsequent encounter: Secondary | ICD-10-CM | POA: Diagnosis not present

## 2019-02-19 DIAGNOSIS — M80051D Age-related osteoporosis with current pathological fracture, right femur, subsequent encounter for fracture with routine healing: Secondary | ICD-10-CM | POA: Diagnosis not present

## 2019-02-21 DIAGNOSIS — M80051D Age-related osteoporosis with current pathological fracture, right femur, subsequent encounter for fracture with routine healing: Secondary | ICD-10-CM | POA: Diagnosis not present

## 2019-02-21 DIAGNOSIS — Z96641 Presence of right artificial hip joint: Secondary | ICD-10-CM | POA: Diagnosis not present

## 2019-02-21 DIAGNOSIS — Z9181 History of falling: Secondary | ICD-10-CM | POA: Diagnosis not present

## 2019-02-21 DIAGNOSIS — W19XXXD Unspecified fall, subsequent encounter: Secondary | ICD-10-CM | POA: Diagnosis not present

## 2019-02-21 DIAGNOSIS — M9701XD Periprosthetic fracture around internal prosthetic right hip joint, subsequent encounter: Secondary | ICD-10-CM | POA: Diagnosis not present

## 2019-02-21 DIAGNOSIS — L89322 Pressure ulcer of left buttock, stage 2: Secondary | ICD-10-CM | POA: Diagnosis not present

## 2019-02-21 DIAGNOSIS — D62 Acute posthemorrhagic anemia: Secondary | ICD-10-CM | POA: Diagnosis not present

## 2019-02-23 DIAGNOSIS — M4322 Fusion of spine, cervical region: Secondary | ICD-10-CM | POA: Diagnosis not present

## 2019-02-23 DIAGNOSIS — M6281 Muscle weakness (generalized): Secondary | ICD-10-CM | POA: Diagnosis not present

## 2019-02-23 DIAGNOSIS — L8940 Pressure ulcer of contiguous site of back, buttock and hip, unspecified stage: Secondary | ICD-10-CM | POA: Diagnosis not present

## 2019-02-23 DIAGNOSIS — S12301D Unspecified nondisplaced fracture of fourth cervical vertebra, subsequent encounter for fracture with routine healing: Secondary | ICD-10-CM | POA: Diagnosis not present

## 2019-02-24 DIAGNOSIS — D62 Acute posthemorrhagic anemia: Secondary | ICD-10-CM | POA: Diagnosis not present

## 2019-02-24 DIAGNOSIS — L89322 Pressure ulcer of left buttock, stage 2: Secondary | ICD-10-CM | POA: Diagnosis not present

## 2019-02-24 DIAGNOSIS — M80051D Age-related osteoporosis with current pathological fracture, right femur, subsequent encounter for fracture with routine healing: Secondary | ICD-10-CM | POA: Diagnosis not present

## 2019-02-24 DIAGNOSIS — Z96641 Presence of right artificial hip joint: Secondary | ICD-10-CM | POA: Diagnosis not present

## 2019-02-24 DIAGNOSIS — Z9181 History of falling: Secondary | ICD-10-CM | POA: Diagnosis not present

## 2019-02-24 DIAGNOSIS — W19XXXD Unspecified fall, subsequent encounter: Secondary | ICD-10-CM | POA: Diagnosis not present

## 2019-02-24 DIAGNOSIS — M9701XD Periprosthetic fracture around internal prosthetic right hip joint, subsequent encounter: Secondary | ICD-10-CM | POA: Diagnosis not present

## 2019-02-27 DIAGNOSIS — W19XXXD Unspecified fall, subsequent encounter: Secondary | ICD-10-CM | POA: Diagnosis not present

## 2019-02-27 DIAGNOSIS — M9701XD Periprosthetic fracture around internal prosthetic right hip joint, subsequent encounter: Secondary | ICD-10-CM | POA: Diagnosis not present

## 2019-02-27 DIAGNOSIS — L89322 Pressure ulcer of left buttock, stage 2: Secondary | ICD-10-CM | POA: Diagnosis not present

## 2019-02-27 DIAGNOSIS — Z9181 History of falling: Secondary | ICD-10-CM | POA: Diagnosis not present

## 2019-02-27 DIAGNOSIS — Z96641 Presence of right artificial hip joint: Secondary | ICD-10-CM | POA: Diagnosis not present

## 2019-02-27 DIAGNOSIS — M80051D Age-related osteoporosis with current pathological fracture, right femur, subsequent encounter for fracture with routine healing: Secondary | ICD-10-CM | POA: Diagnosis not present

## 2019-02-27 DIAGNOSIS — D62 Acute posthemorrhagic anemia: Secondary | ICD-10-CM | POA: Diagnosis not present

## 2019-03-04 DIAGNOSIS — Z96641 Presence of right artificial hip joint: Secondary | ICD-10-CM | POA: Diagnosis not present

## 2019-03-04 DIAGNOSIS — S72302D Unspecified fracture of shaft of left femur, subsequent encounter for closed fracture with routine healing: Secondary | ICD-10-CM | POA: Diagnosis not present

## 2019-03-04 DIAGNOSIS — M978XXD Periprosthetic fracture around other internal prosthetic joint, subsequent encounter: Secondary | ICD-10-CM | POA: Diagnosis not present

## 2019-03-04 DIAGNOSIS — Z471 Aftercare following joint replacement surgery: Secondary | ICD-10-CM | POA: Diagnosis not present

## 2019-03-10 DIAGNOSIS — L89322 Pressure ulcer of left buttock, stage 2: Secondary | ICD-10-CM | POA: Diagnosis not present

## 2019-03-10 DIAGNOSIS — D62 Acute posthemorrhagic anemia: Secondary | ICD-10-CM | POA: Diagnosis not present

## 2019-03-10 DIAGNOSIS — M80051D Age-related osteoporosis with current pathological fracture, right femur, subsequent encounter for fracture with routine healing: Secondary | ICD-10-CM | POA: Diagnosis not present

## 2019-03-10 DIAGNOSIS — M9701XD Periprosthetic fracture around internal prosthetic right hip joint, subsequent encounter: Secondary | ICD-10-CM | POA: Diagnosis not present

## 2019-03-10 DIAGNOSIS — Z96641 Presence of right artificial hip joint: Secondary | ICD-10-CM | POA: Diagnosis not present

## 2019-03-10 DIAGNOSIS — W19XXXD Unspecified fall, subsequent encounter: Secondary | ICD-10-CM | POA: Diagnosis not present

## 2019-03-10 DIAGNOSIS — Z9181 History of falling: Secondary | ICD-10-CM | POA: Diagnosis not present

## 2019-03-18 DIAGNOSIS — I10 Essential (primary) hypertension: Secondary | ICD-10-CM | POA: Diagnosis not present

## 2019-03-20 DIAGNOSIS — Z9181 History of falling: Secondary | ICD-10-CM | POA: Diagnosis not present

## 2019-03-20 DIAGNOSIS — M9701XD Periprosthetic fracture around internal prosthetic right hip joint, subsequent encounter: Secondary | ICD-10-CM | POA: Diagnosis not present

## 2019-03-20 DIAGNOSIS — W19XXXD Unspecified fall, subsequent encounter: Secondary | ICD-10-CM | POA: Diagnosis not present

## 2019-03-20 DIAGNOSIS — L89322 Pressure ulcer of left buttock, stage 2: Secondary | ICD-10-CM | POA: Diagnosis not present

## 2019-03-20 DIAGNOSIS — D62 Acute posthemorrhagic anemia: Secondary | ICD-10-CM | POA: Diagnosis not present

## 2019-03-20 DIAGNOSIS — Z96641 Presence of right artificial hip joint: Secondary | ICD-10-CM | POA: Diagnosis not present

## 2019-03-20 DIAGNOSIS — M80051D Age-related osteoporosis with current pathological fracture, right femur, subsequent encounter for fracture with routine healing: Secondary | ICD-10-CM | POA: Diagnosis not present

## 2019-03-23 DIAGNOSIS — M4322 Fusion of spine, cervical region: Secondary | ICD-10-CM | POA: Diagnosis not present

## 2019-03-23 DIAGNOSIS — D62 Acute posthemorrhagic anemia: Secondary | ICD-10-CM | POA: Diagnosis not present

## 2019-03-23 DIAGNOSIS — L89322 Pressure ulcer of left buttock, stage 2: Secondary | ICD-10-CM | POA: Diagnosis not present

## 2019-03-23 DIAGNOSIS — S12301D Unspecified nondisplaced fracture of fourth cervical vertebra, subsequent encounter for fracture with routine healing: Secondary | ICD-10-CM | POA: Diagnosis not present

## 2019-03-23 DIAGNOSIS — W19XXXD Unspecified fall, subsequent encounter: Secondary | ICD-10-CM | POA: Diagnosis not present

## 2019-03-23 DIAGNOSIS — M6281 Muscle weakness (generalized): Secondary | ICD-10-CM | POA: Diagnosis not present

## 2019-03-23 DIAGNOSIS — Z9181 History of falling: Secondary | ICD-10-CM | POA: Diagnosis not present

## 2019-03-23 DIAGNOSIS — M80051D Age-related osteoporosis with current pathological fracture, right femur, subsequent encounter for fracture with routine healing: Secondary | ICD-10-CM | POA: Diagnosis not present

## 2019-03-23 DIAGNOSIS — M9701XD Periprosthetic fracture around internal prosthetic right hip joint, subsequent encounter: Secondary | ICD-10-CM | POA: Diagnosis not present

## 2019-03-23 DIAGNOSIS — Z96641 Presence of right artificial hip joint: Secondary | ICD-10-CM | POA: Diagnosis not present

## 2019-03-23 DIAGNOSIS — L8940 Pressure ulcer of contiguous site of back, buttock and hip, unspecified stage: Secondary | ICD-10-CM | POA: Diagnosis not present

## 2019-03-24 DIAGNOSIS — Z9181 History of falling: Secondary | ICD-10-CM | POA: Diagnosis not present

## 2019-03-24 DIAGNOSIS — D62 Acute posthemorrhagic anemia: Secondary | ICD-10-CM | POA: Diagnosis not present

## 2019-03-24 DIAGNOSIS — M9701XD Periprosthetic fracture around internal prosthetic right hip joint, subsequent encounter: Secondary | ICD-10-CM | POA: Diagnosis not present

## 2019-03-24 DIAGNOSIS — L89322 Pressure ulcer of left buttock, stage 2: Secondary | ICD-10-CM | POA: Diagnosis not present

## 2019-03-24 DIAGNOSIS — Z96641 Presence of right artificial hip joint: Secondary | ICD-10-CM | POA: Diagnosis not present

## 2019-03-24 DIAGNOSIS — M80051D Age-related osteoporosis with current pathological fracture, right femur, subsequent encounter for fracture with routine healing: Secondary | ICD-10-CM | POA: Diagnosis not present

## 2019-03-24 DIAGNOSIS — W19XXXD Unspecified fall, subsequent encounter: Secondary | ICD-10-CM | POA: Diagnosis not present

## 2019-03-25 DIAGNOSIS — Z7409 Other reduced mobility: Secondary | ICD-10-CM | POA: Diagnosis not present

## 2019-03-25 DIAGNOSIS — M810 Age-related osteoporosis without current pathological fracture: Secondary | ICD-10-CM | POA: Diagnosis not present

## 2019-03-25 DIAGNOSIS — R3129 Other microscopic hematuria: Secondary | ICD-10-CM | POA: Diagnosis not present

## 2019-03-25 DIAGNOSIS — I10 Essential (primary) hypertension: Secondary | ICD-10-CM | POA: Diagnosis not present

## 2019-03-25 DIAGNOSIS — Z0001 Encounter for general adult medical examination with abnormal findings: Secondary | ICD-10-CM | POA: Diagnosis not present

## 2019-03-28 DIAGNOSIS — L89322 Pressure ulcer of left buttock, stage 2: Secondary | ICD-10-CM | POA: Diagnosis not present

## 2019-03-28 DIAGNOSIS — M80051D Age-related osteoporosis with current pathological fracture, right femur, subsequent encounter for fracture with routine healing: Secondary | ICD-10-CM | POA: Diagnosis not present

## 2019-03-28 DIAGNOSIS — W19XXXD Unspecified fall, subsequent encounter: Secondary | ICD-10-CM | POA: Diagnosis not present

## 2019-03-28 DIAGNOSIS — D62 Acute posthemorrhagic anemia: Secondary | ICD-10-CM | POA: Diagnosis not present

## 2019-03-28 DIAGNOSIS — M9701XD Periprosthetic fracture around internal prosthetic right hip joint, subsequent encounter: Secondary | ICD-10-CM | POA: Diagnosis not present

## 2019-03-28 DIAGNOSIS — Z96641 Presence of right artificial hip joint: Secondary | ICD-10-CM | POA: Diagnosis not present

## 2019-03-28 DIAGNOSIS — Z9181 History of falling: Secondary | ICD-10-CM | POA: Diagnosis not present

## 2019-04-01 DIAGNOSIS — Z96641 Presence of right artificial hip joint: Secondary | ICD-10-CM | POA: Diagnosis not present

## 2019-04-01 DIAGNOSIS — D62 Acute posthemorrhagic anemia: Secondary | ICD-10-CM | POA: Diagnosis not present

## 2019-04-01 DIAGNOSIS — L89322 Pressure ulcer of left buttock, stage 2: Secondary | ICD-10-CM | POA: Diagnosis not present

## 2019-04-01 DIAGNOSIS — Z9181 History of falling: Secondary | ICD-10-CM | POA: Diagnosis not present

## 2019-04-01 DIAGNOSIS — W19XXXD Unspecified fall, subsequent encounter: Secondary | ICD-10-CM | POA: Diagnosis not present

## 2019-04-01 DIAGNOSIS — M80051D Age-related osteoporosis with current pathological fracture, right femur, subsequent encounter for fracture with routine healing: Secondary | ICD-10-CM | POA: Diagnosis not present

## 2019-04-01 DIAGNOSIS — M9701XD Periprosthetic fracture around internal prosthetic right hip joint, subsequent encounter: Secondary | ICD-10-CM | POA: Diagnosis not present

## 2019-04-03 DIAGNOSIS — L89322 Pressure ulcer of left buttock, stage 2: Secondary | ICD-10-CM | POA: Diagnosis not present

## 2019-04-03 DIAGNOSIS — W19XXXD Unspecified fall, subsequent encounter: Secondary | ICD-10-CM | POA: Diagnosis not present

## 2019-04-03 DIAGNOSIS — Z9181 History of falling: Secondary | ICD-10-CM | POA: Diagnosis not present

## 2019-04-03 DIAGNOSIS — M9701XD Periprosthetic fracture around internal prosthetic right hip joint, subsequent encounter: Secondary | ICD-10-CM | POA: Diagnosis not present

## 2019-04-03 DIAGNOSIS — M80051D Age-related osteoporosis with current pathological fracture, right femur, subsequent encounter for fracture with routine healing: Secondary | ICD-10-CM | POA: Diagnosis not present

## 2019-04-03 DIAGNOSIS — D62 Acute posthemorrhagic anemia: Secondary | ICD-10-CM | POA: Diagnosis not present

## 2019-04-03 DIAGNOSIS — Z96641 Presence of right artificial hip joint: Secondary | ICD-10-CM | POA: Diagnosis not present

## 2019-04-08 DIAGNOSIS — M810 Age-related osteoporosis without current pathological fracture: Secondary | ICD-10-CM | POA: Diagnosis not present

## 2019-04-08 DIAGNOSIS — Z96649 Presence of unspecified artificial hip joint: Secondary | ICD-10-CM | POA: Diagnosis not present

## 2019-04-08 DIAGNOSIS — M978XXD Periprosthetic fracture around other internal prosthetic joint, subsequent encounter: Secondary | ICD-10-CM | POA: Diagnosis not present

## 2019-04-09 DIAGNOSIS — W19XXXD Unspecified fall, subsequent encounter: Secondary | ICD-10-CM | POA: Diagnosis not present

## 2019-04-09 DIAGNOSIS — D62 Acute posthemorrhagic anemia: Secondary | ICD-10-CM | POA: Diagnosis not present

## 2019-04-09 DIAGNOSIS — M80051D Age-related osteoporosis with current pathological fracture, right femur, subsequent encounter for fracture with routine healing: Secondary | ICD-10-CM | POA: Diagnosis not present

## 2019-04-09 DIAGNOSIS — M9701XD Periprosthetic fracture around internal prosthetic right hip joint, subsequent encounter: Secondary | ICD-10-CM | POA: Diagnosis not present

## 2019-04-09 DIAGNOSIS — L89322 Pressure ulcer of left buttock, stage 2: Secondary | ICD-10-CM | POA: Diagnosis not present

## 2019-04-09 DIAGNOSIS — Z9181 History of falling: Secondary | ICD-10-CM | POA: Diagnosis not present

## 2019-04-09 DIAGNOSIS — Z96641 Presence of right artificial hip joint: Secondary | ICD-10-CM | POA: Diagnosis not present

## 2019-04-11 DIAGNOSIS — Z96641 Presence of right artificial hip joint: Secondary | ICD-10-CM | POA: Diagnosis not present

## 2019-04-11 DIAGNOSIS — M9701XD Periprosthetic fracture around internal prosthetic right hip joint, subsequent encounter: Secondary | ICD-10-CM | POA: Diagnosis not present

## 2019-04-11 DIAGNOSIS — M80051D Age-related osteoporosis with current pathological fracture, right femur, subsequent encounter for fracture with routine healing: Secondary | ICD-10-CM | POA: Diagnosis not present

## 2019-04-11 DIAGNOSIS — L89322 Pressure ulcer of left buttock, stage 2: Secondary | ICD-10-CM | POA: Diagnosis not present

## 2019-04-11 DIAGNOSIS — D62 Acute posthemorrhagic anemia: Secondary | ICD-10-CM | POA: Diagnosis not present

## 2019-04-11 DIAGNOSIS — Z9181 History of falling: Secondary | ICD-10-CM | POA: Diagnosis not present

## 2019-04-11 DIAGNOSIS — W19XXXD Unspecified fall, subsequent encounter: Secondary | ICD-10-CM | POA: Diagnosis not present

## 2019-04-15 DIAGNOSIS — M978XXD Periprosthetic fracture around other internal prosthetic joint, subsequent encounter: Secondary | ICD-10-CM | POA: Diagnosis not present

## 2019-04-15 DIAGNOSIS — M9701XD Periprosthetic fracture around internal prosthetic right hip joint, subsequent encounter: Secondary | ICD-10-CM | POA: Diagnosis not present

## 2019-04-15 DIAGNOSIS — M85851 Other specified disorders of bone density and structure, right thigh: Secondary | ICD-10-CM | POA: Diagnosis not present

## 2019-04-15 DIAGNOSIS — M1711 Unilateral primary osteoarthritis, right knee: Secondary | ICD-10-CM | POA: Diagnosis not present

## 2019-04-15 DIAGNOSIS — M96661 Fracture of femur following insertion of orthopedic implant, joint prosthesis, or bone plate, right leg: Secondary | ICD-10-CM | POA: Diagnosis not present

## 2019-04-15 DIAGNOSIS — Z96641 Presence of right artificial hip joint: Secondary | ICD-10-CM | POA: Diagnosis not present

## 2019-04-15 DIAGNOSIS — M461 Sacroiliitis, not elsewhere classified: Secondary | ICD-10-CM | POA: Diagnosis not present

## 2019-04-15 DIAGNOSIS — Z96649 Presence of unspecified artificial hip joint: Secondary | ICD-10-CM | POA: Diagnosis not present

## 2019-04-15 DIAGNOSIS — Z9889 Other specified postprocedural states: Secondary | ICD-10-CM | POA: Diagnosis not present

## 2019-04-16 DIAGNOSIS — L89322 Pressure ulcer of left buttock, stage 2: Secondary | ICD-10-CM | POA: Diagnosis not present

## 2019-04-16 DIAGNOSIS — Z96641 Presence of right artificial hip joint: Secondary | ICD-10-CM | POA: Diagnosis not present

## 2019-04-16 DIAGNOSIS — Z9181 History of falling: Secondary | ICD-10-CM | POA: Diagnosis not present

## 2019-04-16 DIAGNOSIS — M9701XD Periprosthetic fracture around internal prosthetic right hip joint, subsequent encounter: Secondary | ICD-10-CM | POA: Diagnosis not present

## 2019-04-16 DIAGNOSIS — W19XXXD Unspecified fall, subsequent encounter: Secondary | ICD-10-CM | POA: Diagnosis not present

## 2019-04-16 DIAGNOSIS — M80051D Age-related osteoporosis with current pathological fracture, right femur, subsequent encounter for fracture with routine healing: Secondary | ICD-10-CM | POA: Diagnosis not present

## 2019-04-16 DIAGNOSIS — D62 Acute posthemorrhagic anemia: Secondary | ICD-10-CM | POA: Diagnosis not present

## 2019-04-18 DIAGNOSIS — M9701XD Periprosthetic fracture around internal prosthetic right hip joint, subsequent encounter: Secondary | ICD-10-CM | POA: Diagnosis not present

## 2019-04-18 DIAGNOSIS — M80051D Age-related osteoporosis with current pathological fracture, right femur, subsequent encounter for fracture with routine healing: Secondary | ICD-10-CM | POA: Diagnosis not present

## 2019-04-18 DIAGNOSIS — Z9181 History of falling: Secondary | ICD-10-CM | POA: Diagnosis not present

## 2019-04-18 DIAGNOSIS — D62 Acute posthemorrhagic anemia: Secondary | ICD-10-CM | POA: Diagnosis not present

## 2019-04-18 DIAGNOSIS — W19XXXD Unspecified fall, subsequent encounter: Secondary | ICD-10-CM | POA: Diagnosis not present

## 2019-04-18 DIAGNOSIS — Z96641 Presence of right artificial hip joint: Secondary | ICD-10-CM | POA: Diagnosis not present

## 2019-04-18 DIAGNOSIS — L89322 Pressure ulcer of left buttock, stage 2: Secondary | ICD-10-CM | POA: Diagnosis not present

## 2019-04-22 DIAGNOSIS — Z9181 History of falling: Secondary | ICD-10-CM | POA: Diagnosis not present

## 2019-04-22 DIAGNOSIS — M9701XD Periprosthetic fracture around internal prosthetic right hip joint, subsequent encounter: Secondary | ICD-10-CM | POA: Diagnosis not present

## 2019-04-22 DIAGNOSIS — M80051D Age-related osteoporosis with current pathological fracture, right femur, subsequent encounter for fracture with routine healing: Secondary | ICD-10-CM | POA: Diagnosis not present

## 2019-04-22 DIAGNOSIS — W19XXXD Unspecified fall, subsequent encounter: Secondary | ICD-10-CM | POA: Diagnosis not present

## 2019-04-22 DIAGNOSIS — Z96641 Presence of right artificial hip joint: Secondary | ICD-10-CM | POA: Diagnosis not present

## 2019-04-22 DIAGNOSIS — L89322 Pressure ulcer of left buttock, stage 2: Secondary | ICD-10-CM | POA: Diagnosis not present

## 2019-04-22 DIAGNOSIS — D62 Acute posthemorrhagic anemia: Secondary | ICD-10-CM | POA: Diagnosis not present

## 2019-04-23 DIAGNOSIS — S12301D Unspecified nondisplaced fracture of fourth cervical vertebra, subsequent encounter for fracture with routine healing: Secondary | ICD-10-CM | POA: Diagnosis not present

## 2019-04-23 DIAGNOSIS — M4322 Fusion of spine, cervical region: Secondary | ICD-10-CM | POA: Diagnosis not present

## 2019-04-23 DIAGNOSIS — M6281 Muscle weakness (generalized): Secondary | ICD-10-CM | POA: Diagnosis not present

## 2019-04-23 DIAGNOSIS — L8940 Pressure ulcer of contiguous site of back, buttock and hip, unspecified stage: Secondary | ICD-10-CM | POA: Diagnosis not present

## 2019-04-28 DIAGNOSIS — Z96641 Presence of right artificial hip joint: Secondary | ICD-10-CM | POA: Diagnosis not present

## 2019-04-28 DIAGNOSIS — L89322 Pressure ulcer of left buttock, stage 2: Secondary | ICD-10-CM | POA: Diagnosis not present

## 2019-04-28 DIAGNOSIS — D62 Acute posthemorrhagic anemia: Secondary | ICD-10-CM | POA: Diagnosis not present

## 2019-04-28 DIAGNOSIS — Z9181 History of falling: Secondary | ICD-10-CM | POA: Diagnosis not present

## 2019-04-28 DIAGNOSIS — M9701XD Periprosthetic fracture around internal prosthetic right hip joint, subsequent encounter: Secondary | ICD-10-CM | POA: Diagnosis not present

## 2019-04-28 DIAGNOSIS — M80051D Age-related osteoporosis with current pathological fracture, right femur, subsequent encounter for fracture with routine healing: Secondary | ICD-10-CM | POA: Diagnosis not present

## 2019-04-28 DIAGNOSIS — W19XXXD Unspecified fall, subsequent encounter: Secondary | ICD-10-CM | POA: Diagnosis not present

## 2019-05-12 DIAGNOSIS — Z9181 History of falling: Secondary | ICD-10-CM | POA: Diagnosis not present

## 2019-05-12 DIAGNOSIS — M80051D Age-related osteoporosis with current pathological fracture, right femur, subsequent encounter for fracture with routine healing: Secondary | ICD-10-CM | POA: Diagnosis not present

## 2019-05-12 DIAGNOSIS — L89322 Pressure ulcer of left buttock, stage 2: Secondary | ICD-10-CM | POA: Diagnosis not present

## 2019-05-12 DIAGNOSIS — D62 Acute posthemorrhagic anemia: Secondary | ICD-10-CM | POA: Diagnosis not present

## 2019-05-12 DIAGNOSIS — Z96641 Presence of right artificial hip joint: Secondary | ICD-10-CM | POA: Diagnosis not present

## 2019-05-12 DIAGNOSIS — W19XXXD Unspecified fall, subsequent encounter: Secondary | ICD-10-CM | POA: Diagnosis not present

## 2019-05-12 DIAGNOSIS — M9701XD Periprosthetic fracture around internal prosthetic right hip joint, subsequent encounter: Secondary | ICD-10-CM | POA: Diagnosis not present

## 2019-05-23 DIAGNOSIS — L8940 Pressure ulcer of contiguous site of back, buttock and hip, unspecified stage: Secondary | ICD-10-CM | POA: Diagnosis not present

## 2019-05-23 DIAGNOSIS — M4322 Fusion of spine, cervical region: Secondary | ICD-10-CM | POA: Diagnosis not present

## 2019-05-23 DIAGNOSIS — S12301D Unspecified nondisplaced fracture of fourth cervical vertebra, subsequent encounter for fracture with routine healing: Secondary | ICD-10-CM | POA: Diagnosis not present

## 2019-05-23 DIAGNOSIS — M6281 Muscle weakness (generalized): Secondary | ICD-10-CM | POA: Diagnosis not present

## 2019-06-11 DIAGNOSIS — R7301 Impaired fasting glucose: Secondary | ICD-10-CM | POA: Diagnosis not present

## 2019-06-11 DIAGNOSIS — K5900 Constipation, unspecified: Secondary | ICD-10-CM | POA: Diagnosis not present

## 2019-06-11 DIAGNOSIS — K5730 Diverticulosis of large intestine without perforation or abscess without bleeding: Secondary | ICD-10-CM | POA: Diagnosis not present

## 2019-06-11 DIAGNOSIS — R109 Unspecified abdominal pain: Secondary | ICD-10-CM | POA: Diagnosis not present

## 2019-06-11 DIAGNOSIS — K439 Ventral hernia without obstruction or gangrene: Secondary | ICD-10-CM | POA: Diagnosis not present

## 2019-06-11 DIAGNOSIS — I723 Aneurysm of iliac artery: Secondary | ICD-10-CM | POA: Diagnosis not present

## 2019-06-11 DIAGNOSIS — R110 Nausea: Secondary | ICD-10-CM | POA: Diagnosis not present

## 2019-06-23 DIAGNOSIS — L8940 Pressure ulcer of contiguous site of back, buttock and hip, unspecified stage: Secondary | ICD-10-CM | POA: Diagnosis not present

## 2019-06-23 DIAGNOSIS — M4322 Fusion of spine, cervical region: Secondary | ICD-10-CM | POA: Diagnosis not present

## 2019-06-23 DIAGNOSIS — M6281 Muscle weakness (generalized): Secondary | ICD-10-CM | POA: Diagnosis not present

## 2019-06-23 DIAGNOSIS — S12301D Unspecified nondisplaced fracture of fourth cervical vertebra, subsequent encounter for fracture with routine healing: Secondary | ICD-10-CM | POA: Diagnosis not present

## 2019-07-23 DIAGNOSIS — M4322 Fusion of spine, cervical region: Secondary | ICD-10-CM | POA: Diagnosis not present

## 2019-07-23 DIAGNOSIS — S12301D Unspecified nondisplaced fracture of fourth cervical vertebra, subsequent encounter for fracture with routine healing: Secondary | ICD-10-CM | POA: Diagnosis not present

## 2019-07-23 DIAGNOSIS — M6281 Muscle weakness (generalized): Secondary | ICD-10-CM | POA: Diagnosis not present

## 2019-07-23 DIAGNOSIS — L8940 Pressure ulcer of contiguous site of back, buttock and hip, unspecified stage: Secondary | ICD-10-CM | POA: Diagnosis not present

## 2019-08-23 DIAGNOSIS — M6281 Muscle weakness (generalized): Secondary | ICD-10-CM | POA: Diagnosis not present

## 2019-08-23 DIAGNOSIS — L8940 Pressure ulcer of contiguous site of back, buttock and hip, unspecified stage: Secondary | ICD-10-CM | POA: Diagnosis not present

## 2019-08-23 DIAGNOSIS — M4322 Fusion of spine, cervical region: Secondary | ICD-10-CM | POA: Diagnosis not present

## 2019-08-23 DIAGNOSIS — S12301D Unspecified nondisplaced fracture of fourth cervical vertebra, subsequent encounter for fracture with routine healing: Secondary | ICD-10-CM | POA: Diagnosis not present

## 2019-09-23 DIAGNOSIS — M6281 Muscle weakness (generalized): Secondary | ICD-10-CM | POA: Diagnosis not present

## 2019-09-23 DIAGNOSIS — S12301D Unspecified nondisplaced fracture of fourth cervical vertebra, subsequent encounter for fracture with routine healing: Secondary | ICD-10-CM | POA: Diagnosis not present

## 2019-09-23 DIAGNOSIS — L8940 Pressure ulcer of contiguous site of back, buttock and hip, unspecified stage: Secondary | ICD-10-CM | POA: Diagnosis not present

## 2019-09-23 DIAGNOSIS — M4322 Fusion of spine, cervical region: Secondary | ICD-10-CM | POA: Diagnosis not present

## 2019-10-22 DIAGNOSIS — R82998 Other abnormal findings in urine: Secondary | ICD-10-CM | POA: Diagnosis not present

## 2019-10-22 DIAGNOSIS — I499 Cardiac arrhythmia, unspecified: Secondary | ICD-10-CM | POA: Diagnosis not present

## 2019-10-23 DIAGNOSIS — L8940 Pressure ulcer of contiguous site of back, buttock and hip, unspecified stage: Secondary | ICD-10-CM | POA: Diagnosis not present

## 2019-10-23 DIAGNOSIS — M4322 Fusion of spine, cervical region: Secondary | ICD-10-CM | POA: Diagnosis not present

## 2019-10-23 DIAGNOSIS — M6281 Muscle weakness (generalized): Secondary | ICD-10-CM | POA: Diagnosis not present

## 2019-10-23 DIAGNOSIS — S12301D Unspecified nondisplaced fracture of fourth cervical vertebra, subsequent encounter for fracture with routine healing: Secondary | ICD-10-CM | POA: Diagnosis not present

## 2019-11-23 DIAGNOSIS — S12301D Unspecified nondisplaced fracture of fourth cervical vertebra, subsequent encounter for fracture with routine healing: Secondary | ICD-10-CM | POA: Diagnosis not present

## 2019-11-23 DIAGNOSIS — L8940 Pressure ulcer of contiguous site of back, buttock and hip, unspecified stage: Secondary | ICD-10-CM | POA: Diagnosis not present

## 2019-11-23 DIAGNOSIS — M4322 Fusion of spine, cervical region: Secondary | ICD-10-CM | POA: Diagnosis not present

## 2019-11-23 DIAGNOSIS — M6281 Muscle weakness (generalized): Secondary | ICD-10-CM | POA: Diagnosis not present

## 2019-12-23 DIAGNOSIS — M6281 Muscle weakness (generalized): Secondary | ICD-10-CM | POA: Diagnosis not present

## 2019-12-23 DIAGNOSIS — S12301D Unspecified nondisplaced fracture of fourth cervical vertebra, subsequent encounter for fracture with routine healing: Secondary | ICD-10-CM | POA: Diagnosis not present

## 2019-12-23 DIAGNOSIS — M4322 Fusion of spine, cervical region: Secondary | ICD-10-CM | POA: Diagnosis not present

## 2019-12-23 DIAGNOSIS — L8940 Pressure ulcer of contiguous site of back, buttock and hip, unspecified stage: Secondary | ICD-10-CM | POA: Diagnosis not present

## 2020-01-23 DIAGNOSIS — S12301D Unspecified nondisplaced fracture of fourth cervical vertebra, subsequent encounter for fracture with routine healing: Secondary | ICD-10-CM | POA: Diagnosis not present

## 2020-01-23 DIAGNOSIS — M6281 Muscle weakness (generalized): Secondary | ICD-10-CM | POA: Diagnosis not present

## 2020-01-23 DIAGNOSIS — M4322 Fusion of spine, cervical region: Secondary | ICD-10-CM | POA: Diagnosis not present

## 2020-01-23 DIAGNOSIS — L8940 Pressure ulcer of contiguous site of back, buttock and hip, unspecified stage: Secondary | ICD-10-CM | POA: Diagnosis not present

## 2020-03-15 DIAGNOSIS — Z993 Dependence on wheelchair: Secondary | ICD-10-CM | POA: Diagnosis not present

## 2020-03-15 DIAGNOSIS — N390 Urinary tract infection, site not specified: Secondary | ICD-10-CM | POA: Diagnosis not present

## 2020-03-15 DIAGNOSIS — N3941 Urge incontinence: Secondary | ICD-10-CM | POA: Diagnosis not present

## 2020-03-15 DIAGNOSIS — N3001 Acute cystitis with hematuria: Secondary | ICD-10-CM | POA: Diagnosis not present

## 2020-03-15 DIAGNOSIS — R300 Dysuria: Secondary | ICD-10-CM | POA: Diagnosis not present

## 2020-03-15 DIAGNOSIS — D649 Anemia, unspecified: Secondary | ICD-10-CM | POA: Diagnosis not present

## 2020-03-16 DIAGNOSIS — D649 Anemia, unspecified: Secondary | ICD-10-CM | POA: Diagnosis not present

## 2020-03-16 DIAGNOSIS — N390 Urinary tract infection, site not specified: Secondary | ICD-10-CM | POA: Diagnosis not present

## 2020-03-16 DIAGNOSIS — Z993 Dependence on wheelchair: Secondary | ICD-10-CM | POA: Diagnosis not present

## 2020-03-17 DIAGNOSIS — D649 Anemia, unspecified: Secondary | ICD-10-CM | POA: Diagnosis not present

## 2020-03-17 DIAGNOSIS — N390 Urinary tract infection, site not specified: Secondary | ICD-10-CM | POA: Diagnosis not present

## 2020-03-17 DIAGNOSIS — Z993 Dependence on wheelchair: Secondary | ICD-10-CM | POA: Diagnosis not present

## 2020-03-24 DIAGNOSIS — N390 Urinary tract infection, site not specified: Secondary | ICD-10-CM | POA: Diagnosis not present

## 2020-03-24 DIAGNOSIS — R310 Gross hematuria: Secondary | ICD-10-CM | POA: Diagnosis not present

## 2020-03-24 DIAGNOSIS — M810 Age-related osteoporosis without current pathological fracture: Secondary | ICD-10-CM | POA: Diagnosis not present

## 2020-03-24 DIAGNOSIS — I10 Essential (primary) hypertension: Secondary | ICD-10-CM | POA: Diagnosis not present

## 2020-03-24 DIAGNOSIS — N529 Male erectile dysfunction, unspecified: Secondary | ICD-10-CM | POA: Diagnosis not present

## 2020-03-29 DIAGNOSIS — Z7409 Other reduced mobility: Secondary | ICD-10-CM | POA: Diagnosis not present

## 2020-03-29 DIAGNOSIS — N401 Enlarged prostate with lower urinary tract symptoms: Secondary | ICD-10-CM | POA: Diagnosis not present

## 2020-03-29 DIAGNOSIS — I10 Essential (primary) hypertension: Secondary | ICD-10-CM | POA: Diagnosis not present

## 2020-03-29 DIAGNOSIS — Z0001 Encounter for general adult medical examination with abnormal findings: Secondary | ICD-10-CM | POA: Diagnosis not present

## 2020-03-29 DIAGNOSIS — M810 Age-related osteoporosis without current pathological fracture: Secondary | ICD-10-CM | POA: Diagnosis not present

## 2020-05-19 DIAGNOSIS — J3489 Other specified disorders of nose and nasal sinuses: Secondary | ICD-10-CM | POA: Diagnosis not present

## 2020-05-19 DIAGNOSIS — R0981 Nasal congestion: Secondary | ICD-10-CM | POA: Diagnosis not present

## 2020-05-19 DIAGNOSIS — R058 Other specified cough: Secondary | ICD-10-CM | POA: Diagnosis not present

## 2020-05-19 DIAGNOSIS — Z20822 Contact with and (suspected) exposure to covid-19: Secondary | ICD-10-CM | POA: Diagnosis not present

## 2020-05-19 DIAGNOSIS — J0191 Acute recurrent sinusitis, unspecified: Secondary | ICD-10-CM | POA: Diagnosis not present

## 2020-05-19 DIAGNOSIS — M47812 Spondylosis without myelopathy or radiculopathy, cervical region: Secondary | ICD-10-CM | POA: Diagnosis not present

## 2020-06-03 DIAGNOSIS — J3489 Other specified disorders of nose and nasal sinuses: Secondary | ICD-10-CM | POA: Diagnosis not present

## 2020-08-05 DIAGNOSIS — J310 Chronic rhinitis: Secondary | ICD-10-CM | POA: Diagnosis not present

## 2020-08-05 DIAGNOSIS — K529 Noninfective gastroenteritis and colitis, unspecified: Secondary | ICD-10-CM | POA: Diagnosis not present

## 2020-08-05 DIAGNOSIS — R531 Weakness: Secondary | ICD-10-CM | POA: Diagnosis not present

## 2020-08-05 DIAGNOSIS — R109 Unspecified abdominal pain: Secondary | ICD-10-CM | POA: Diagnosis not present

## 2020-08-05 DIAGNOSIS — D72829 Elevated white blood cell count, unspecified: Secondary | ICD-10-CM | POA: Diagnosis not present

## 2020-08-05 DIAGNOSIS — D649 Anemia, unspecified: Secondary | ICD-10-CM | POA: Diagnosis not present

## 2020-08-05 DIAGNOSIS — N400 Enlarged prostate without lower urinary tract symptoms: Secondary | ICD-10-CM | POA: Diagnosis not present

## 2020-08-05 DIAGNOSIS — K5790 Diverticulosis of intestine, part unspecified, without perforation or abscess without bleeding: Secondary | ICD-10-CM | POA: Diagnosis not present

## 2020-08-05 DIAGNOSIS — R490 Dysphonia: Secondary | ICD-10-CM | POA: Diagnosis not present

## 2020-08-06 DIAGNOSIS — K529 Noninfective gastroenteritis and colitis, unspecified: Secondary | ICD-10-CM | POA: Diagnosis not present

## 2020-10-30 DIAGNOSIS — N481 Balanitis: Secondary | ICD-10-CM | POA: Diagnosis not present

## 2021-02-15 DIAGNOSIS — M170 Bilateral primary osteoarthritis of knee: Secondary | ICD-10-CM | POA: Diagnosis not present

## 2021-02-15 DIAGNOSIS — I10 Essential (primary) hypertension: Secondary | ICD-10-CM | POA: Diagnosis not present

## 2021-02-15 DIAGNOSIS — M810 Age-related osteoporosis without current pathological fracture: Secondary | ICD-10-CM | POA: Diagnosis not present

## 2021-02-15 DIAGNOSIS — K5730 Diverticulosis of large intestine without perforation or abscess without bleeding: Secondary | ICD-10-CM | POA: Diagnosis not present

## 2021-02-22 DIAGNOSIS — I440 Atrioventricular block, first degree: Secondary | ICD-10-CM | POA: Diagnosis not present

## 2021-02-22 DIAGNOSIS — I959 Hypotension, unspecified: Secondary | ICD-10-CM | POA: Diagnosis not present

## 2021-02-22 DIAGNOSIS — R531 Weakness: Secondary | ICD-10-CM | POA: Diagnosis not present

## 2021-02-22 DIAGNOSIS — E860 Dehydration: Secondary | ICD-10-CM | POA: Diagnosis not present

## 2021-02-22 DIAGNOSIS — D696 Thrombocytopenia, unspecified: Secondary | ICD-10-CM | POA: Diagnosis not present

## 2021-02-22 DIAGNOSIS — I214 Non-ST elevation (NSTEMI) myocardial infarction: Secondary | ICD-10-CM | POA: Diagnosis not present

## 2021-02-22 DIAGNOSIS — N179 Acute kidney failure, unspecified: Secondary | ICD-10-CM | POA: Diagnosis not present

## 2021-02-22 DIAGNOSIS — R001 Bradycardia, unspecified: Secondary | ICD-10-CM | POA: Diagnosis not present

## 2021-02-22 DIAGNOSIS — R55 Syncope and collapse: Secondary | ICD-10-CM | POA: Diagnosis not present

## 2021-02-22 DIAGNOSIS — K5900 Constipation, unspecified: Secondary | ICD-10-CM | POA: Diagnosis not present

## 2021-02-22 DIAGNOSIS — R42 Dizziness and giddiness: Secondary | ICD-10-CM | POA: Diagnosis not present

## 2021-02-23 DIAGNOSIS — R42 Dizziness and giddiness: Secondary | ICD-10-CM | POA: Diagnosis not present

## 2021-02-23 DIAGNOSIS — R55 Syncope and collapse: Secondary | ICD-10-CM | POA: Diagnosis not present

## 2021-02-23 DIAGNOSIS — K5900 Constipation, unspecified: Secondary | ICD-10-CM | POA: Diagnosis not present

## 2021-02-23 DIAGNOSIS — I440 Atrioventricular block, first degree: Secondary | ICD-10-CM | POA: Diagnosis not present

## 2021-02-23 DIAGNOSIS — D696 Thrombocytopenia, unspecified: Secondary | ICD-10-CM | POA: Diagnosis not present

## 2021-02-23 DIAGNOSIS — I248 Other forms of acute ischemic heart disease: Secondary | ICD-10-CM | POA: Diagnosis not present

## 2021-02-23 DIAGNOSIS — N179 Acute kidney failure, unspecified: Secondary | ICD-10-CM | POA: Diagnosis not present

## 2021-03-30 DIAGNOSIS — Z0000 Encounter for general adult medical examination without abnormal findings: Secondary | ICD-10-CM | POA: Diagnosis not present

## 2021-03-30 DIAGNOSIS — Z125 Encounter for screening for malignant neoplasm of prostate: Secondary | ICD-10-CM | POA: Diagnosis not present

## 2021-03-30 DIAGNOSIS — E559 Vitamin D deficiency, unspecified: Secondary | ICD-10-CM | POA: Diagnosis not present

## 2021-04-20 DIAGNOSIS — Z0000 Encounter for general adult medical examination without abnormal findings: Secondary | ICD-10-CM | POA: Diagnosis not present

## 2021-07-04 DIAGNOSIS — B370 Candidal stomatitis: Secondary | ICD-10-CM | POA: Diagnosis not present

## 2021-10-15 DIAGNOSIS — I10 Essential (primary) hypertension: Secondary | ICD-10-CM | POA: Diagnosis not present

## 2021-10-15 DIAGNOSIS — K5730 Diverticulosis of large intestine without perforation or abscess without bleeding: Secondary | ICD-10-CM | POA: Diagnosis not present

## 2021-10-15 DIAGNOSIS — M170 Bilateral primary osteoarthritis of knee: Secondary | ICD-10-CM | POA: Diagnosis not present

## 2021-10-15 DIAGNOSIS — M810 Age-related osteoporosis without current pathological fracture: Secondary | ICD-10-CM | POA: Diagnosis not present

## 2022-02-13 DIAGNOSIS — R0989 Other specified symptoms and signs involving the circulatory and respiratory systems: Secondary | ICD-10-CM | POA: Diagnosis not present

## 2022-02-13 DIAGNOSIS — Z23 Encounter for immunization: Secondary | ICD-10-CM | POA: Diagnosis not present

## 2022-04-05 DIAGNOSIS — Z23 Encounter for immunization: Secondary | ICD-10-CM | POA: Diagnosis not present

## 2022-04-07 DIAGNOSIS — R1084 Generalized abdominal pain: Secondary | ICD-10-CM | POA: Diagnosis not present

## 2022-04-08 ENCOUNTER — Telehealth: Payer: Self-pay | Admitting: *Deleted

## 2022-04-08 DIAGNOSIS — Z96641 Presence of right artificial hip joint: Secondary | ICD-10-CM | POA: Diagnosis not present

## 2022-04-08 DIAGNOSIS — E871 Hypo-osmolality and hyponatremia: Secondary | ICD-10-CM | POA: Diagnosis not present

## 2022-04-08 DIAGNOSIS — R112 Nausea with vomiting, unspecified: Secondary | ICD-10-CM | POA: Diagnosis not present

## 2022-04-08 DIAGNOSIS — K6389 Other specified diseases of intestine: Secondary | ICD-10-CM | POA: Diagnosis not present

## 2022-04-08 DIAGNOSIS — D649 Anemia, unspecified: Secondary | ICD-10-CM | POA: Diagnosis not present

## 2022-04-08 DIAGNOSIS — R109 Unspecified abdominal pain: Secondary | ICD-10-CM | POA: Diagnosis not present

## 2022-04-08 DIAGNOSIS — Z981 Arthrodesis status: Secondary | ICD-10-CM | POA: Diagnosis not present

## 2022-04-08 DIAGNOSIS — K529 Noninfective gastroenteritis and colitis, unspecified: Secondary | ICD-10-CM | POA: Diagnosis not present

## 2022-04-08 DIAGNOSIS — N179 Acute kidney failure, unspecified: Secondary | ICD-10-CM | POA: Diagnosis not present

## 2022-04-08 NOTE — Patient Outreach (Signed)
  Care Coordination   04/08/2022 Name: Joshua Frost MRN: [id number] DOB: 1918/04/05   Care Coordination Outreach Attempts:  An unsuccessful telephone outreach was attempted today to offer the patient information about available care coordination services as a benefit of their health plan.   Follow Up Plan:  Additional outreach attempts will be made to offer the patient care coordination information and services.   Encounter Outcome:  No Answer   Care Coordination Interventions:  No, not indicated    Eduard Clos, MSW, [place] Worker [facility] [employer] [facility] [phone]

## 2022-04-10 DIAGNOSIS — N179 Acute kidney failure, unspecified: Secondary | ICD-10-CM | POA: Diagnosis not present

## 2022-04-10 DIAGNOSIS — I493 Ventricular premature depolarization: Secondary | ICD-10-CM | POA: Diagnosis not present

## 2022-04-12 DIAGNOSIS — R638 Other symptoms and signs concerning food and fluid intake: Secondary | ICD-10-CM | POA: Diagnosis not present

## 2022-04-12 DIAGNOSIS — E875 Hyperkalemia: Secondary | ICD-10-CM | POA: Diagnosis not present

## 2022-04-12 DIAGNOSIS — N179 Acute kidney failure, unspecified: Secondary | ICD-10-CM | POA: Diagnosis not present

## 2022-04-17 DIAGNOSIS — E875 Hyperkalemia: Secondary | ICD-10-CM | POA: Diagnosis not present

## 2022-04-17 DIAGNOSIS — N1832 Chronic kidney disease, stage 3b: Secondary | ICD-10-CM | POA: Diagnosis not present

## 2022-04-17 DIAGNOSIS — R638 Other symptoms and signs concerning food and fluid intake: Secondary | ICD-10-CM | POA: Diagnosis not present

## 2022-04-19 DIAGNOSIS — E875 Hyperkalemia: Secondary | ICD-10-CM | POA: Diagnosis not present

## 2022-04-21 DIAGNOSIS — R5383 Other fatigue: Secondary | ICD-10-CM | POA: Diagnosis not present

## 2022-04-21 DIAGNOSIS — Z125 Encounter for screening for malignant neoplasm of prostate: Secondary | ICD-10-CM | POA: Diagnosis not present

## 2022-04-21 DIAGNOSIS — I10 Essential (primary) hypertension: Secondary | ICD-10-CM | POA: Diagnosis not present

## 2022-04-21 DIAGNOSIS — E7800 Pure hypercholesterolemia, unspecified: Secondary | ICD-10-CM | POA: Diagnosis not present

## 2022-04-21 DIAGNOSIS — A084 Viral intestinal infection, unspecified: Secondary | ICD-10-CM | POA: Diagnosis not present

## 2022-04-21 DIAGNOSIS — N179 Acute kidney failure, unspecified: Secondary | ICD-10-CM | POA: Diagnosis not present

## 2022-04-21 DIAGNOSIS — Z09 Encounter for follow-up examination after completed treatment for conditions other than malignant neoplasm: Secondary | ICD-10-CM | POA: Diagnosis not present

## 2022-04-21 DIAGNOSIS — E559 Vitamin D deficiency, unspecified: Secondary | ICD-10-CM | POA: Diagnosis not present

## 2022-04-22 DIAGNOSIS — N179 Acute kidney failure, unspecified: Secondary | ICD-10-CM | POA: Diagnosis not present

## 2022-04-26 DIAGNOSIS — M170 Bilateral primary osteoarthritis of knee: Secondary | ICD-10-CM | POA: Diagnosis not present

## 2022-04-26 DIAGNOSIS — E559 Vitamin D deficiency, unspecified: Secondary | ICD-10-CM | POA: Diagnosis not present

## 2022-04-26 DIAGNOSIS — Z0000 Encounter for general adult medical examination without abnormal findings: Secondary | ICD-10-CM | POA: Diagnosis not present

## 2022-04-26 DIAGNOSIS — M810 Age-related osteoporosis without current pathological fracture: Secondary | ICD-10-CM | POA: Diagnosis not present

## 2022-04-26 DIAGNOSIS — I10 Essential (primary) hypertension: Secondary | ICD-10-CM | POA: Diagnosis not present

## 2022-04-26 DIAGNOSIS — N1832 Chronic kidney disease, stage 3b: Secondary | ICD-10-CM | POA: Diagnosis not present

## 2022-04-26 DIAGNOSIS — N401 Enlarged prostate with lower urinary tract symptoms: Secondary | ICD-10-CM | POA: Diagnosis not present

## 2022-06-21 DIAGNOSIS — Z8639 Personal history of other endocrine, nutritional and metabolic disease: Secondary | ICD-10-CM | POA: Diagnosis not present

## 2022-06-21 DIAGNOSIS — R3129 Other microscopic hematuria: Secondary | ICD-10-CM | POA: Diagnosis not present

## 2022-06-21 DIAGNOSIS — N179 Acute kidney failure, unspecified: Secondary | ICD-10-CM | POA: Diagnosis not present

## 2022-06-21 DIAGNOSIS — R7989 Other specified abnormal findings of blood chemistry: Secondary | ICD-10-CM | POA: Diagnosis not present

## 2022-06-21 DIAGNOSIS — D649 Anemia, unspecified: Secondary | ICD-10-CM | POA: Diagnosis not present

## 2022-06-21 DIAGNOSIS — R944 Abnormal results of kidney function studies: Secondary | ICD-10-CM | POA: Diagnosis not present

## 2022-07-23 DIAGNOSIS — R1084 Generalized abdominal pain: Secondary | ICD-10-CM | POA: Diagnosis not present

## 2022-07-27 DIAGNOSIS — R233 Spontaneous ecchymoses: Secondary | ICD-10-CM | POA: Diagnosis not present

## 2022-08-11 DIAGNOSIS — R34 Anuria and oliguria: Secondary | ICD-10-CM | POA: Diagnosis not present

## 2022-08-11 DIAGNOSIS — R634 Abnormal weight loss: Secondary | ICD-10-CM | POA: Diagnosis not present

## 2022-08-11 DIAGNOSIS — K5909 Other constipation: Secondary | ICD-10-CM | POA: Diagnosis not present

## 2022-09-04 DIAGNOSIS — K529 Noninfective gastroenteritis and colitis, unspecified: Secondary | ICD-10-CM | POA: Diagnosis not present

## 2022-09-04 DIAGNOSIS — B351 Tinea unguium: Secondary | ICD-10-CM | POA: Diagnosis not present

## 2022-09-20 DIAGNOSIS — B351 Tinea unguium: Secondary | ICD-10-CM | POA: Diagnosis not present

## 2022-09-20 DIAGNOSIS — M79672 Pain in left foot: Secondary | ICD-10-CM | POA: Diagnosis not present

## 2022-09-20 DIAGNOSIS — M79671 Pain in right foot: Secondary | ICD-10-CM | POA: Diagnosis not present

## 2022-09-20 DIAGNOSIS — N189 Chronic kidney disease, unspecified: Secondary | ICD-10-CM | POA: Diagnosis not present

## 2022-09-25 DIAGNOSIS — N184 Chronic kidney disease, stage 4 (severe): Secondary | ICD-10-CM | POA: Diagnosis not present

## 2022-09-25 DIAGNOSIS — D631 Anemia in chronic kidney disease: Secondary | ICD-10-CM | POA: Diagnosis not present

## 2022-09-25 DIAGNOSIS — R3121 Asymptomatic microscopic hematuria: Secondary | ICD-10-CM | POA: Diagnosis not present

## 2022-09-25 DIAGNOSIS — E875 Hyperkalemia: Secondary | ICD-10-CM | POA: Diagnosis not present

## 2022-10-04 DIAGNOSIS — I10 Essential (primary) hypertension: Secondary | ICD-10-CM | POA: Diagnosis not present

## 2022-10-04 DIAGNOSIS — Z23 Encounter for immunization: Secondary | ICD-10-CM | POA: Diagnosis not present

## 2022-10-10 DIAGNOSIS — R3129 Other microscopic hematuria: Secondary | ICD-10-CM | POA: Diagnosis not present

## 2022-10-10 DIAGNOSIS — E875 Hyperkalemia: Secondary | ICD-10-CM | POA: Diagnosis not present

## 2022-10-31 DIAGNOSIS — R109 Unspecified abdominal pain: Secondary | ICD-10-CM | POA: Diagnosis not present

## 2022-10-31 DIAGNOSIS — L89151 Pressure ulcer of sacral region, stage 1: Secondary | ICD-10-CM | POA: Diagnosis not present

## 2022-12-25 DIAGNOSIS — E875 Hyperkalemia: Secondary | ICD-10-CM | POA: Diagnosis not present

## 2022-12-25 DIAGNOSIS — N184 Chronic kidney disease, stage 4 (severe): Secondary | ICD-10-CM | POA: Diagnosis not present

## 2022-12-25 DIAGNOSIS — R3121 Asymptomatic microscopic hematuria: Secondary | ICD-10-CM | POA: Diagnosis not present

## 2022-12-25 DIAGNOSIS — D631 Anemia in chronic kidney disease: Secondary | ICD-10-CM | POA: Diagnosis not present

## 2023-01-15 DIAGNOSIS — J90 Pleural effusion, not elsewhere classified: Secondary | ICD-10-CM | POA: Diagnosis not present

## 2023-01-15 DIAGNOSIS — Z66 Do not resuscitate: Secondary | ICD-10-CM | POA: Diagnosis not present

## 2023-01-15 DIAGNOSIS — I4891 Unspecified atrial fibrillation: Secondary | ICD-10-CM | POA: Diagnosis not present

## 2023-01-15 DIAGNOSIS — R0602 Shortness of breath: Secondary | ICD-10-CM | POA: Diagnosis not present

## 2023-01-15 DIAGNOSIS — R2689 Other abnormalities of gait and mobility: Secondary | ICD-10-CM | POA: Diagnosis not present

## 2023-01-15 DIAGNOSIS — R9431 Abnormal electrocardiogram [ECG] [EKG]: Secondary | ICD-10-CM | POA: Diagnosis not present

## 2023-01-15 DIAGNOSIS — N1832 Chronic kidney disease, stage 3b: Secondary | ICD-10-CM | POA: Diagnosis not present

## 2023-01-15 DIAGNOSIS — Z7409 Other reduced mobility: Secondary | ICD-10-CM | POA: Diagnosis not present

## 2023-01-17 DIAGNOSIS — I4891 Unspecified atrial fibrillation: Secondary | ICD-10-CM | POA: Diagnosis not present

## 2023-01-18 DIAGNOSIS — N184 Chronic kidney disease, stage 4 (severe): Secondary | ICD-10-CM | POA: Diagnosis not present

## 2023-01-18 DIAGNOSIS — I480 Paroxysmal atrial fibrillation: Secondary | ICD-10-CM | POA: Diagnosis not present

## 2023-01-19 DIAGNOSIS — N179 Acute kidney failure, unspecified: Secondary | ICD-10-CM | POA: Diagnosis not present

## 2023-01-19 DIAGNOSIS — I4891 Unspecified atrial fibrillation: Secondary | ICD-10-CM | POA: Diagnosis not present

## 2023-01-19 DIAGNOSIS — I998 Other disorder of circulatory system: Secondary | ICD-10-CM | POA: Diagnosis not present

## 2023-01-19 DIAGNOSIS — M79605 Pain in left leg: Secondary | ICD-10-CM | POA: Diagnosis not present

## 2023-01-20 DIAGNOSIS — I499 Cardiac arrhythmia, unspecified: Secondary | ICD-10-CM | POA: Diagnosis not present

## 2023-01-20 DIAGNOSIS — Z743 Need for continuous supervision: Secondary | ICD-10-CM | POA: Diagnosis not present

## 2023-01-20 DIAGNOSIS — R0902 Hypoxemia: Secondary | ICD-10-CM | POA: Diagnosis not present

## 2023-01-20 DIAGNOSIS — Z7401 Bed confinement status: Secondary | ICD-10-CM | POA: Diagnosis not present

## 2023-01-20 DIAGNOSIS — R410 Disorientation, unspecified: Secondary | ICD-10-CM | POA: Diagnosis not present

## 2023-01-22 DIAGNOSIS — I4891 Unspecified atrial fibrillation: Secondary | ICD-10-CM | POA: Diagnosis not present

## 2023-02-17 DEATH — deceased
